# Patient Record
Sex: Female | Born: 1940
Health system: Southern US, Community
[De-identification: ages and names within clinical notes are randomized; demographics above are authoritative.]

## PROBLEM LIST (undated history)

## (undated) DIAGNOSIS — J439 Emphysema, unspecified: Secondary | ICD-10-CM

## (undated) DIAGNOSIS — E7800 Pure hypercholesterolemia, unspecified: Secondary | ICD-10-CM

## (undated) DIAGNOSIS — I10 Essential (primary) hypertension: Secondary | ICD-10-CM

## (undated) DIAGNOSIS — E559 Vitamin D deficiency, unspecified: Secondary | ICD-10-CM

## (undated) DIAGNOSIS — H04123 Dry eye syndrome of bilateral lacrimal glands: Secondary | ICD-10-CM

## (undated) DIAGNOSIS — R55 Syncope and collapse: Secondary | ICD-10-CM

## (undated) DIAGNOSIS — M8580 Other specified disorders of bone density and structure, unspecified site: Secondary | ICD-10-CM

## (undated) DIAGNOSIS — M542 Cervicalgia: Secondary | ICD-10-CM

## (undated) HISTORY — DX: Vitamin D deficiency, unspecified: E55.9

## (undated) HISTORY — DX: Syncope and collapse: R55

## (undated) HISTORY — DX: Other specified disorders of bone density and structure, unspecified site: M85.80

## (undated) HISTORY — DX: Emphysema, unspecified: J43.9

## (undated) HISTORY — PX: CATARACT EXTRACTION: SUR2

## (undated) HISTORY — DX: Cervicalgia: M54.2

## (undated) HISTORY — DX: Essential (primary) hypertension: I10

## (undated) HISTORY — PX: SQUAMOUS CELL CARCINOMA EXCISION: SHX2433

## (undated) HISTORY — DX: Dry eye syndrome of bilateral lacrimal glands: H04.123

## (undated) HISTORY — DX: Pure hypercholesterolemia, unspecified: E78.00

---

## 2000-08-29 ENCOUNTER — Other Ambulatory Visit: Admission: RE | Admit: 2000-08-29 | Discharge: 2000-08-29 | Payer: Self-pay | Admitting: *Deleted

## 2003-11-29 ENCOUNTER — Ambulatory Visit (HOSPITAL_COMMUNITY): Admission: RE | Admit: 2003-11-29 | Discharge: 2003-11-29 | Payer: Self-pay | Admitting: Ophthalmology

## 2003-12-13 ENCOUNTER — Ambulatory Visit (HOSPITAL_COMMUNITY): Admission: RE | Admit: 2003-12-13 | Discharge: 2003-12-13 | Payer: Self-pay | Admitting: Dermatology

## 2006-03-03 ENCOUNTER — Emergency Department (HOSPITAL_COMMUNITY): Admission: EM | Admit: 2006-03-03 | Discharge: 2006-03-03 | Payer: Self-pay | Admitting: Emergency Medicine

## 2006-03-03 ENCOUNTER — Encounter (INDEPENDENT_AMBULATORY_CARE_PROVIDER_SITE_OTHER): Payer: Self-pay | Admitting: Cardiology

## 2006-03-11 ENCOUNTER — Ambulatory Visit: Payer: Self-pay | Admitting: Internal Medicine

## 2006-03-25 ENCOUNTER — Ambulatory Visit: Payer: Self-pay | Admitting: Internal Medicine

## 2006-04-09 ENCOUNTER — Ambulatory Visit: Payer: Self-pay | Admitting: Internal Medicine

## 2006-07-07 ENCOUNTER — Ambulatory Visit: Payer: Self-pay | Admitting: Internal Medicine

## 2006-07-07 LAB — CONVERTED CEMR LAB
ALT: 9 units/L (ref 0–40)
AST: 17 units/L (ref 0–37)
Cholesterol: 183 mg/dL (ref 0–200)
HDL: 43.9 mg/dL (ref >39.0)
Triglyceride fasting, serum: 88 mg/dL (ref 0–149)

## 2006-11-10 ENCOUNTER — Ambulatory Visit: Payer: Self-pay | Admitting: Internal Medicine

## 2006-11-10 LAB — CONVERTED CEMR LAB
ALT: 12 units/L (ref 0–40)
Cholesterol: 197 mg/dL (ref 0–200)
Total CHOL/HDL Ratio: 4.2
VLDL: 16 mg/dL (ref 0–40)

## 2006-11-18 ENCOUNTER — Ambulatory Visit: Payer: Self-pay | Admitting: Internal Medicine

## 2006-11-25 ENCOUNTER — Ambulatory Visit: Payer: Self-pay | Admitting: Internal Medicine

## 2006-12-03 ENCOUNTER — Ambulatory Visit: Payer: Self-pay | Admitting: Internal Medicine

## 2006-12-03 LAB — CONVERTED CEMR LAB
Albumin: 4.2 g/dL (ref 3.5–5.2)
Alkaline Phosphatase: 90 units/L (ref 39–117)
BUN: 9 mg/dL (ref 6–23)
Basophils Absolute: 0 10*3/uL (ref 0.0–0.1)
Bilirubin, Direct: 0.2 mg/dL (ref 0.0–0.3)
Creatinine, Ser: 0.9 mg/dL (ref 0.4–1.2)
Eosinophils Absolute: 0 10*3/uL (ref 0.0–0.6)
Eosinophils Relative: 0.2 % (ref 0.0–5.0)
GFR calc Af Amer: 81 mL/min
GFR calc non Af Amer: 67 mL/min
HCT: 48.7 % — ABNORMAL HIGH (ref 36.0–46.0)
Hemoglobin: 16.8 g/dL — ABNORMAL HIGH (ref 12.0–15.0)
MCV: 94.8 fL (ref 78.0–100.0)
Magnesium: 2.2 mg/dL (ref 1.5–2.5)
Monocytes Absolute: 0.6 10*3/uL (ref 0.2–0.7)
Neutro Abs: 9.6 10*3/uL — ABNORMAL HIGH (ref 1.4–7.7)
Neutrophils Relative %: 69.3 % (ref 43.0–77.0)
Potassium: 4.2 meq/L (ref 3.5–5.1)
RBC: 5.14 M/uL — ABNORMAL HIGH (ref 3.87–5.11)
Sodium: 137 meq/L (ref 135–145)
T3, Free: 3.3 pg/mL (ref 2.3–4.2)
Total CK: 49 units/L (ref 7–177)
WBC: 13.7 10*3/uL — ABNORMAL HIGH (ref 4.5–10.5)

## 2006-12-05 ENCOUNTER — Encounter (INDEPENDENT_AMBULATORY_CARE_PROVIDER_SITE_OTHER): Payer: Self-pay | Admitting: Diagnostic Radiology

## 2006-12-05 ENCOUNTER — Ambulatory Visit: Payer: Self-pay | Admitting: Internal Medicine

## 2006-12-05 ENCOUNTER — Encounter: Payer: Self-pay | Admitting: Internal Medicine

## 2006-12-05 ENCOUNTER — Other Ambulatory Visit: Admission: RE | Admit: 2006-12-05 | Discharge: 2006-12-05 | Payer: Self-pay | Admitting: Internal Medicine

## 2006-12-05 ENCOUNTER — Encounter: Admission: RE | Admit: 2006-12-05 | Discharge: 2006-12-05 | Payer: Self-pay | Admitting: Internal Medicine

## 2006-12-16 ENCOUNTER — Ambulatory Visit: Payer: Self-pay | Admitting: Internal Medicine

## 2006-12-17 ENCOUNTER — Encounter: Payer: Self-pay | Admitting: Internal Medicine

## 2006-12-18 ENCOUNTER — Ambulatory Visit: Payer: Self-pay | Admitting: Internal Medicine

## 2006-12-31 ENCOUNTER — Ambulatory Visit: Payer: Self-pay | Admitting: Endocrinology

## 2006-12-31 LAB — CONVERTED CEMR LAB: Cortisol, Plasma: 27.1 ug/dL

## 2007-01-01 ENCOUNTER — Encounter: Payer: Self-pay | Admitting: Internal Medicine

## 2007-01-01 DIAGNOSIS — E785 Hyperlipidemia, unspecified: Secondary | ICD-10-CM

## 2007-01-01 DIAGNOSIS — F17200 Nicotine dependence, unspecified, uncomplicated: Secondary | ICD-10-CM

## 2007-01-02 ENCOUNTER — Encounter: Payer: Self-pay | Admitting: Endocrinology

## 2007-01-02 LAB — CONVERTED CEMR LAB
Catecholamines Tot(E+NE) 24 Hr U: 0.073 mg/24hr
Dopamine 24 Hr Urine: 215 mcg/24hr (ref <500)
Epinephrine 24 Hr Urine: 7 mcg/24hr (ref <20)
Metanephrines, Ur: 112 (ref 19–140)
Norepinephrine 24 Hr Urine: 66 mcg/24hr (ref <80)
Normetanephrine, 24H Ur: 383 — ABNORMAL HIGH (ref 52–310)

## 2007-06-04 ENCOUNTER — Encounter: Admission: RE | Admit: 2007-06-04 | Discharge: 2007-06-04 | Payer: Self-pay | Admitting: Internal Medicine

## 2008-01-08 ENCOUNTER — Encounter: Admission: RE | Admit: 2008-01-08 | Discharge: 2008-01-08 | Payer: Self-pay | Admitting: Internal Medicine

## 2008-02-15 ENCOUNTER — Other Ambulatory Visit: Admission: RE | Admit: 2008-02-15 | Discharge: 2008-02-15 | Payer: Self-pay | Admitting: Obstetrics & Gynecology

## 2009-01-27 ENCOUNTER — Encounter: Admission: RE | Admit: 2009-01-27 | Discharge: 2009-01-27 | Payer: Self-pay | Admitting: Internal Medicine

## 2009-06-02 ENCOUNTER — Emergency Department (HOSPITAL_COMMUNITY): Admission: EM | Admit: 2009-06-02 | Discharge: 2009-06-02 | Payer: Self-pay | Admitting: Emergency Medicine

## 2009-08-08 ENCOUNTER — Inpatient Hospital Stay (HOSPITAL_COMMUNITY): Admission: EM | Admit: 2009-08-08 | Discharge: 2009-08-10 | Payer: Self-pay | Admitting: Emergency Medicine

## 2010-09-12 LAB — URINE MICROSCOPIC-ADD ON

## 2010-09-12 LAB — COMPREHENSIVE METABOLIC PANEL
AST: 20 U/L (ref 0–37)
Alkaline Phosphatase: 92 U/L (ref 39–117)
BUN: 4 mg/dL — ABNORMAL LOW (ref 6–23)
BUN: 7 mg/dL (ref 6–23)
BUN: 9 mg/dL (ref 6–23)
CO2: 26 mEq/L (ref 19–32)
Calcium: 8.8 mg/dL (ref 8.4–10.5)
Chloride: 101 mEq/L (ref 96–112)
Chloride: 108 mEq/L (ref 96–112)
Creatinine, Ser: 0.54 mg/dL (ref 0.4–1.2)
Creatinine, Ser: 0.56 mg/dL (ref 0.4–1.2)
GFR calc Af Amer: >60 mL/min (ref >60)
GFR calc Af Amer: >60 mL/min (ref >60)
GFR calc non Af Amer: >60 mL/min (ref >60)
Glucose, Bld: 101 mg/dL — ABNORMAL HIGH (ref 70–99)
Glucose, Bld: 106 mg/dL — ABNORMAL HIGH (ref 70–99)
Glucose, Bld: 89 mg/dL (ref 70–99)
Sodium: 135 mEq/L (ref 135–145)
Sodium: 137 mEq/L (ref 135–145)
Total Bilirubin: 0.4 mg/dL (ref 0.3–1.2)
Total Protein: 5.6 g/dL — ABNORMAL LOW (ref 6.0–8.3)
Total Protein: 6.8 g/dL (ref 6.0–8.3)

## 2010-09-12 LAB — DIFFERENTIAL
Basophils Absolute: 0 10*3/uL (ref 0.0–0.1)
Basophils Absolute: 0 10*3/uL (ref 0.0–0.1)
Basophils Relative: 0 % (ref 0–1)
Eosinophils Absolute: 0 10*3/uL (ref 0.0–0.7)
Eosinophils Relative: 1 % (ref 0–5)
Lymphocytes Relative: 11 % — ABNORMAL LOW (ref 12–46)
Lymphocytes Relative: 13 % (ref 12–46)
Lymphocytes Relative: 16 % (ref 12–46)
Lymphs Abs: 2 10*3/uL (ref 0.7–4.0)
Lymphs Abs: 3 10*3/uL (ref 0.7–4.0)
Monocytes Absolute: 1 10*3/uL (ref 0.1–1.0)
Monocytes Relative: 6 % (ref 3–12)
Neutro Abs: 14.1 10*3/uL — ABNORMAL HIGH (ref 1.7–7.7)
Neutrophils Relative %: 76 % (ref 43–77)
Neutrophils Relative %: 78 % — ABNORMAL HIGH (ref 43–77)

## 2010-09-12 LAB — CBC
HCT: 34.8 % — ABNORMAL LOW (ref 36.0–46.0)
HCT: 36 % (ref 36.0–46.0)
Hemoglobin: 12.1 g/dL (ref 12.0–15.0)
Hemoglobin: 12.8 g/dL (ref 12.0–15.0)
Hemoglobin: 13.4 g/dL (ref 12.0–15.0)
MCHC: 34.4 g/dL (ref 30.0–36.0)
MCHC: 35.6 g/dL (ref 30.0–36.0)
MCV: 95.2 fL (ref 78.0–100.0)
MCV: 95.8 fL (ref 78.0–100.0)
RBC: 3.63 MIL/uL — ABNORMAL LOW (ref 3.87–5.11)
RBC: 4.09 MIL/uL (ref 3.87–5.11)
RDW: 12.6 % (ref 11.5–15.5)
WBC: 15.4 10*3/uL — ABNORMAL HIGH (ref 4.0–10.5)
WBC: 17.9 10*3/uL — ABNORMAL HIGH (ref 4.0–10.5)

## 2010-09-12 LAB — URINALYSIS, ROUTINE W REFLEX MICROSCOPIC
Bilirubin Urine: NEGATIVE
Ketones, ur: NEGATIVE mg/dL

## 2010-09-12 LAB — CULTURE, BLOOD (ROUTINE X 2)

## 2010-09-12 LAB — URINE CULTURE: Colony Count: NO GROWTH

## 2010-09-12 LAB — CLOSTRIDIUM DIFFICILE EIA

## 2010-09-25 LAB — URINE MICROSCOPIC-ADD ON

## 2010-09-25 LAB — DIFFERENTIAL
Basophils Absolute: 0 10*3/uL (ref 0.0–0.1)
Eosinophils Relative: 0 % (ref 0–5)
Lymphocytes Relative: 15 % (ref 12–46)
Lymphs Abs: 2.1 10*3/uL (ref 0.7–4.0)
Monocytes Absolute: 0.5 10*3/uL (ref 0.1–1.0)
Monocytes Relative: 4 % (ref 3–12)

## 2010-09-25 LAB — URINALYSIS, ROUTINE W REFLEX MICROSCOPIC
Glucose, UA: NEGATIVE mg/dL
Specific Gravity, Urine: 1.023 (ref 1.005–1.030)
Urobilinogen, UA: 0.2 mg/dL (ref 0.0–1.0)

## 2010-09-25 LAB — BASIC METABOLIC PANEL
GFR calc Af Amer: >60 mL/min (ref >60)
GFR calc non Af Amer: >60 mL/min (ref >60)
Glucose, Bld: 110 mg/dL — ABNORMAL HIGH (ref 70–99)
Potassium: 5.2 mEq/L — ABNORMAL HIGH (ref 3.5–5.1)
Sodium: 134 mEq/L — ABNORMAL LOW (ref 135–145)

## 2010-09-25 LAB — URINE CULTURE

## 2010-09-25 LAB — CBC
HCT: 44.7 % (ref 36.0–46.0)
Hemoglobin: 15.4 g/dL — ABNORMAL HIGH (ref 12.0–15.0)
RBC: 4.66 MIL/uL (ref 3.87–5.11)
RDW: 12.7 % (ref 11.5–15.5)

## 2010-09-25 LAB — POCT CARDIAC MARKERS
CKMB, poc: <1 ng/mL — ABNORMAL LOW (ref 1.0–8.0)
Troponin i, poc: <0.05 ng/mL (ref 0.00–0.09)

## 2010-11-06 NOTE — Consult Note (Signed)
[facility]                          ENDOCRINOLOGY CONSULTATION   Alyssa Howard, Alyssa Howard                     MRN:          [id number]  DATE:12/31/2006                            DOB:          1940/07/16    REFERRING PHYSICIAN:  Neta Mends. Panosh, MD   REASON FOR REFERRAL:  Weight loss.   HISTORY OF PRESENT ILLNESS:  A 70 year old woman with 8 months of  fatigue.  She has associated slight tremor of her hands as well as  anxiety and palpitations.  She also has a 30-pound weight loss with a  poor appetite.   PAST MEDICAL HISTORY:  Otherwise healthy.   MEDICATIONS:  No medications.   SOCIAL HISTORY:  She does not work outside the home.  She is here with  her husband.   FAMILY HISTORY:  Negative for the above.   REVIEW OF SYSTEMS:  Denies the following:  Fever, headache, syncope and  abdominal pain.   PHYSICAL EXAMINATION:  Blood pressure 123/76, heart rate 98, temperature  97.5 and the weight is 113.  GENERAL:  No distress.  SKIN:  No rash, not diaphoretic.  HEENT:  No proptosis.  No periorbital swelling.  NECK:  Thyroid is normal.  CHEST:  Clear to auscultation.  No respiratory distress.  CARDIOVASCULAR:  No edema.  Regular rate and rhythm.  No murmur.  Pedal  pulses are intact.  NEUROLOGIC:  Alert and oriented.  Does not appear anxious nor depressed  and there is a slight postural tremor present.   LABORATORY STUDIES:  Forwarded by Dr. Fabian Sharp:  On March 12, 2006,  TSH 1.7.  On Nov 02, 2006, TSH 1.96.   Abnormal chest CT is also noted.   In the office on December 31, 2006, she has an ACTH stimulation test,  baseline cortisol 14.7 mcg/dL.  She then received 250 mcg of  cosyntropin.  Forty-five minutes after the injection, a repeat cortisol  was 27 mcg/dL.   IMPRESSION:  1. Weight loss of uncertain etiology.  2. She is euthyroid.  3. Other symptoms as noted above.  4. Adrenal insufficiency is ruled out.  5. Abnormal chest CT.    PLAN:  1. Check 24-hour urine for catecholamines and metanephrines.  2. I told the patient that if this is normal, I cannot find any      endocrine etiology for her symptoms.  3. Continue followup for abnormal CT, as Dr. Fabian Sharp is doing.     Sean A. Everardo All, MD  Electronically Signed    SAE/MedQ  DD: 01/03/2007  DT: 01/05/2007  Job #: [id number]   cc:   Neta Mends. Fabian Sharp, MD

## 2010-11-09 NOTE — Assessment & Plan Note (Signed)
[facility] OFFICE NOTE   Alyssa Howard, Alyssa Howard                     MRN:          [id number]  DATE:03/11/2006                            DOB:          1940-12-14    CHIEF COMPLAINT:  The patient needs cholesterol checked after ER evaluation.   HISTORY OF PRESENT ILLNESS:  Alyssa Howard is a 70 year old, one pack per day  since age 32, married, retired, white female comes in today for the above  reason.  She had been seen at the practice a number of years ago, more than  five years ago, but has not had any need to go to the doctor because she  has generally been well.  However, on the evening of February 28, 2006, when  she got up in the middle of the night to use the bathroom, she remembers  reaching for the door, hitting her right forehead and passing out at that  time.  She felt badly afterwards and lay down.  She vomited 2 separate times  that evening and then improved without any recurrence; however, she still  did not feel just right, felt that her arms were heavy and had some bruises  on her right arm and her husband took her to the emergency room on March 03, 2006, where she had a 10-hour evaluation which included EKGs, general  lab work, MRI of the brain and head, and MRI of the C spine without  contrast.  There was no acute stroke but did show some mild atrophy and  small vessel disease.  C spine showed negative cord contusion or fracture  but some central stenosis in C3-C4, C4-C5, and C5-C6, some mild to moderate  focal stenosis of the right proximal subclavian artery with mild narrowing  of the proximal right vertebral artery, otherwise there was no  hemodynamically significant stenosis in either carotid bifurcation.  The  emergency room physician had recommended she come in for followup and  cholesterol check because of the above.  Since that time she has felt well  with no recurrence of her  symptoms.   PAST MEDICAL HISTORY:  See database.  Positive for:  1. Chicken Pulse ox.  2. High cholesterol, otherwise noncontributory.  3. No surgeries.  4. She is gravida 1, para 1.  5. Her last tetanus shot, she believes, was in 24.  6. Denies any other surgeries or hospitalizations.   MEDICATIONS:  Calcium supplements.   DRUG ALLERGIES:  NONE BUT GETS NAUSEA AND VOMITING WITH PAIN MEDICATIONS.   FAMILY HISTORY:  Positive for hyperlipidemia in her mother and also type 2  diabetes.  She has 2 sisters that are apparently well and no family history  of arrhythmias.   SOCIAL HISTORY:  Retired, household of 2, 8-9 hours of sleep, no alcohol,  one pack per day tried to quit but was only successful 6 months in her  lifetime, some caffeine, see database.   REVIEW OF SYSTEMS:  Negative for chest pain, shortness of breath,  palpitations, recurrent dizziness.  She did have significant anxiety in the  MRI and prefers never to have that again, and hesitates to do more of a  workup but does want to know what her cholesterol is.   OBJECTIVE:  VITAL SIGNS:  Weight 144 pounds, temperature 97.1, pulse 88 and  regular, respirations 16, blood pressure 110/82.  GENERAL:  WD/WN, healthy-appearing, elderly lady in no acute distress.  HEENT:  Normocephalic.  TMs clear.  Tongue midline.  OP clear.  NECK:  No bruits or masses are felt.  CHEST:  CTAP is equal.  CARDIAC:  S1 S2.  No gallops.  Question 1/6 systolic murmur at the right  upper sternal border lying in the supine position.  No bruits are heard over  the femoral, carotids, and abdominal areas.  Pulses are intact.  ABDOMEN:  Soft without organomegaly, guarding, or rebound.  NEUROLOGIC:  Appears intact with no focal deficits of motor skills.  Cranial  nerves III-XII appear normal but hearing is not tested.  Gait is within  normal limits.  There is negative Romberg.   IMPRESSION:  1. Syncope which sounds like vasovagal but in a high risk  situation.  2. Abnormal MR angiogram of the neck and subclavian.  3. Risk for vascular disease which is tobacco and history of      hyperlipidemia.   PLAN:  Discussed getting fasting lipids, TSH, and BMP today to check her  blood sugars and then plan consult as appropriate after I get those back.  She needs to be on risk reduction therapy including tobacco cessation,  etcetera.  Would recommend getting a consult from the vascular surgeons who  can look at her MRA and her history at that time.                                   Neta Mends. Fabian Sharp, MD   WKP/MedQ  DD:  03/11/2006  DT:  03/12/2006  Job #:  [id number]

## 2010-12-10 IMAGING — CR DG CHEST 2V
2 series · 2 of 2 positions shown · non-contrast
Comparison: 06/02/2009

CLINICAL DATA: Cough, fever

CHEST - 2 VIEW

[w chest lat]
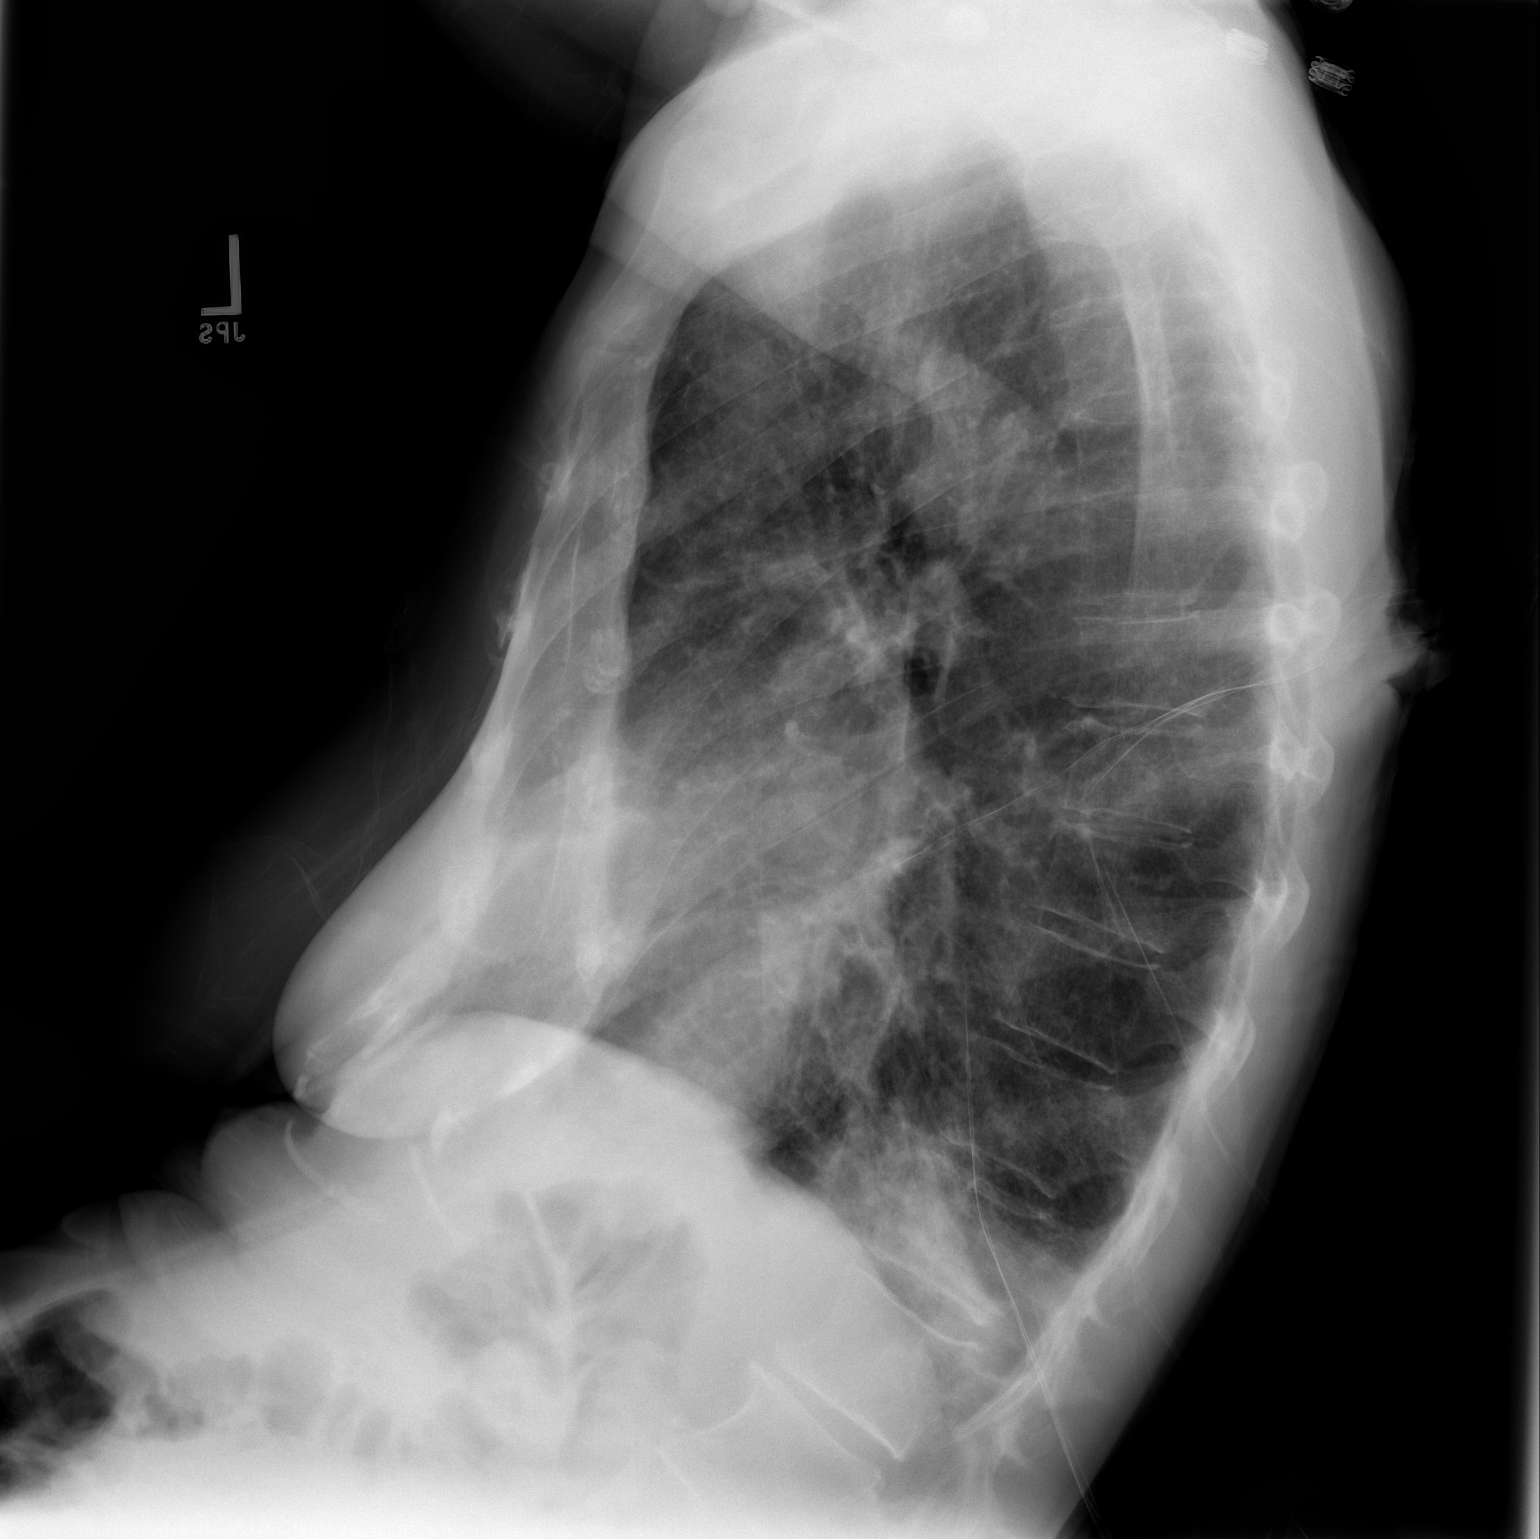

[w chest pa]
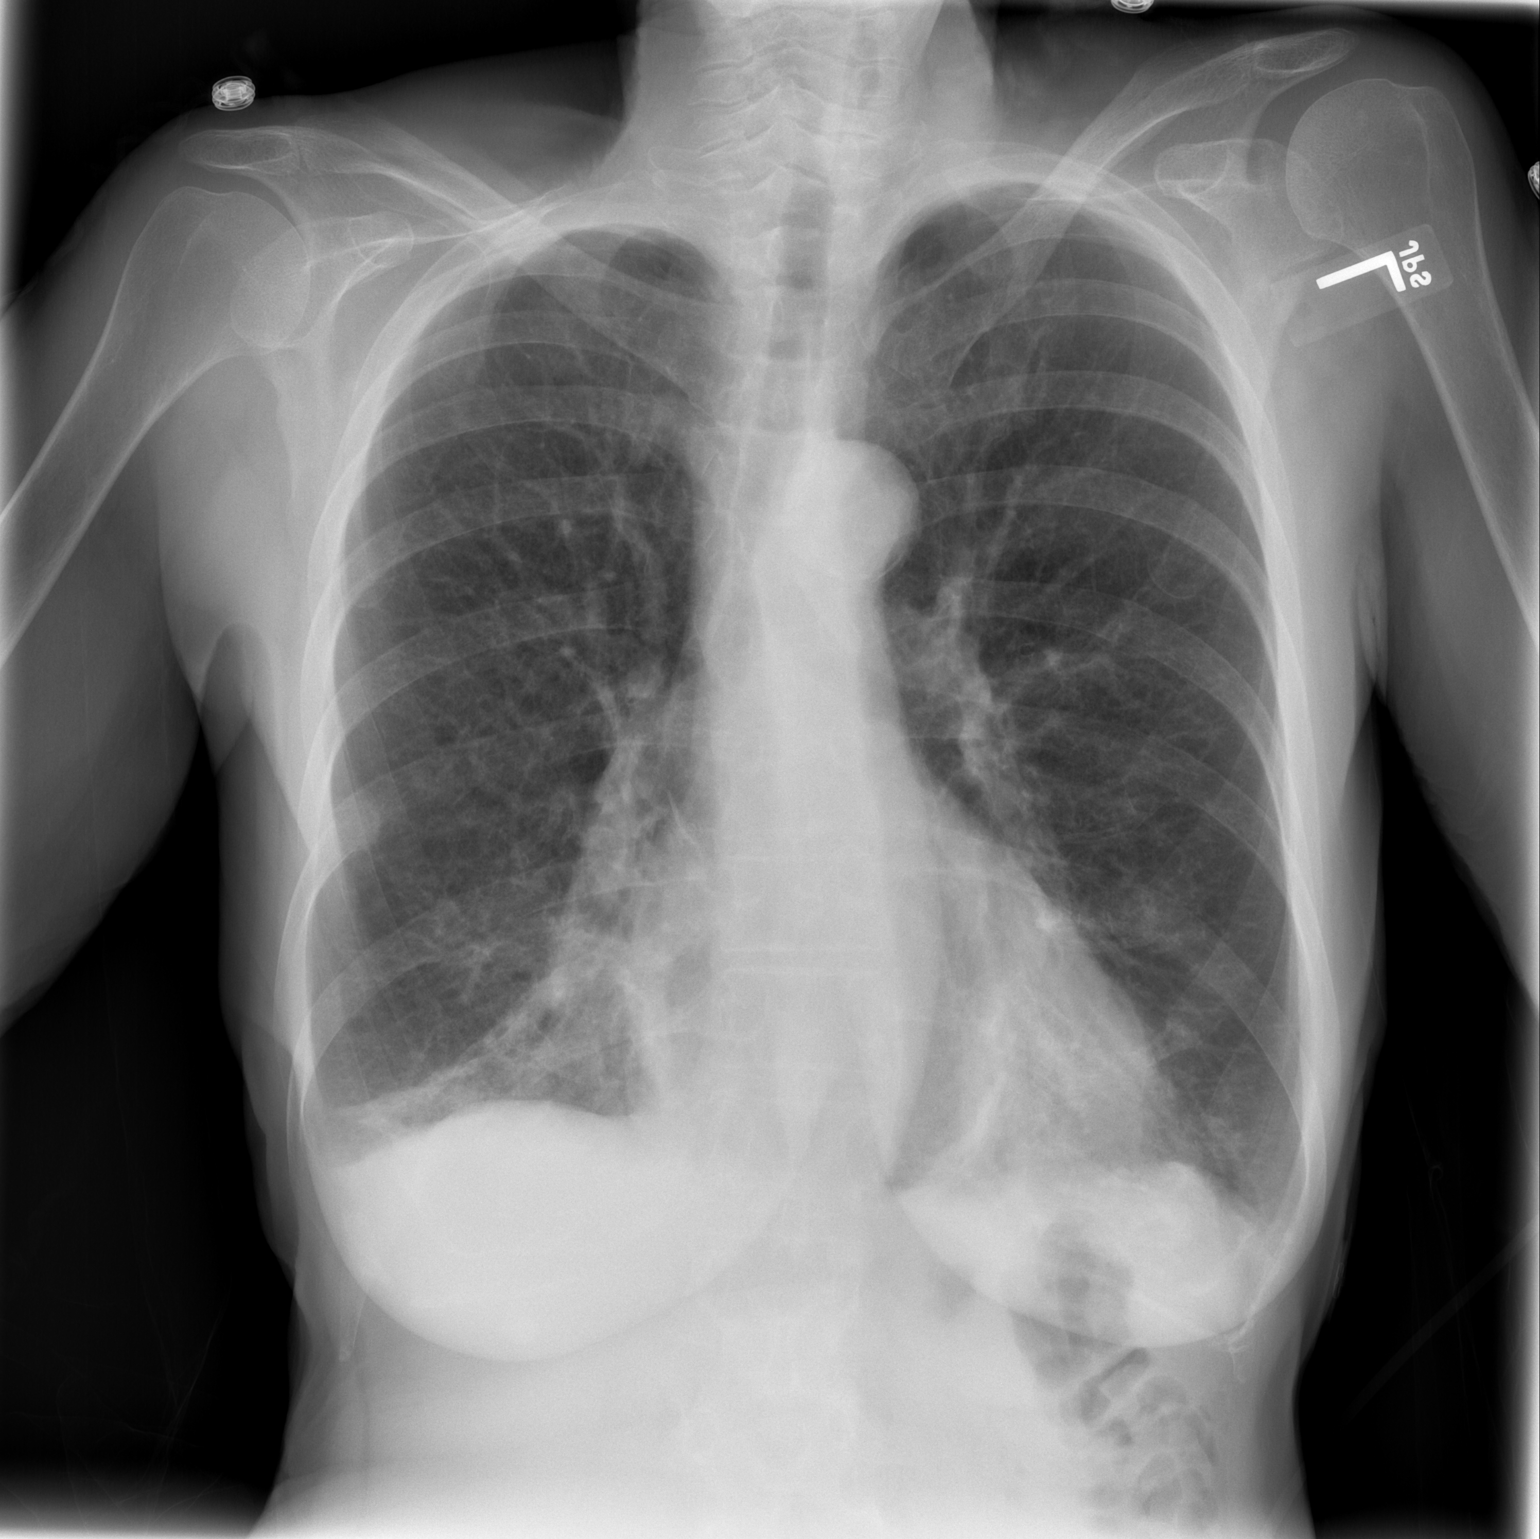

[2 of 2 positions shown; findings below may reference images not displayed]

FINDINGS: New right lower lobe consolidation is noted with air
bronchogram formation.  Linear retrocardiac probable atelectasis
versus additional area of pulmonary consolidation noted.  No
pleural effusion identified.  Heart size is normal.  Hyperinflation
again noted.
IMPRESSION: New right lower lobe and possibly left lower lobe airspace opacity,
compatible with pneumonia given the clinical context.

## 2011-02-05 ENCOUNTER — Other Ambulatory Visit: Payer: Self-pay | Admitting: Surgery

## 2011-02-05 DIAGNOSIS — Z1231 Encounter for screening mammogram for malignant neoplasm of breast: Secondary | ICD-10-CM

## 2011-02-15 ENCOUNTER — Other Ambulatory Visit: Payer: Self-pay | Admitting: Internal Medicine

## 2011-02-15 ENCOUNTER — Ambulatory Visit
Admission: RE | Admit: 2011-02-15 | Discharge: 2011-02-15 | Disposition: A | Discharge status: Home / Self Care | Payer: Medicare Other | Source: Ambulatory Visit | Attending: Surgery | Admitting: Surgery

## 2011-02-15 DIAGNOSIS — Z1231 Encounter for screening mammogram for malignant neoplasm of breast: Secondary | ICD-10-CM

## 2011-06-27 DIAGNOSIS — R0609 Other forms of dyspnea: Secondary | ICD-10-CM | POA: Diagnosis not present

## 2011-06-27 DIAGNOSIS — R1031 Right lower quadrant pain: Secondary | ICD-10-CM | POA: Diagnosis not present

## 2011-07-16 DIAGNOSIS — R109 Unspecified abdominal pain: Secondary | ICD-10-CM | POA: Diagnosis not present

## 2011-07-16 DIAGNOSIS — R1031 Right lower quadrant pain: Secondary | ICD-10-CM | POA: Diagnosis not present

## 2011-07-16 DIAGNOSIS — Q6101 Congenital single renal cyst: Secondary | ICD-10-CM | POA: Diagnosis not present

## 2011-08-26 ENCOUNTER — Encounter: Payer: Self-pay | Admitting: Gastroenterology

## 2011-09-11 ENCOUNTER — Other Ambulatory Visit: Payer: Self-pay

## 2011-09-11 DIAGNOSIS — L821 Other seborrheic keratosis: Secondary | ICD-10-CM | POA: Diagnosis not present

## 2011-09-11 DIAGNOSIS — C44721 Squamous cell carcinoma of skin of unspecified lower limb, including hip: Secondary | ICD-10-CM | POA: Diagnosis not present

## 2011-09-11 DIAGNOSIS — L570 Actinic keratosis: Secondary | ICD-10-CM | POA: Diagnosis not present

## 2011-09-25 ENCOUNTER — Encounter: Payer: Self-pay | Admitting: *Deleted

## 2011-10-23 DIAGNOSIS — Z85828 Personal history of other malignant neoplasm of skin: Secondary | ICD-10-CM | POA: Diagnosis not present

## 2011-10-23 DIAGNOSIS — D485 Neoplasm of uncertain behavior of skin: Secondary | ICD-10-CM | POA: Diagnosis not present

## 2011-10-23 DIAGNOSIS — L570 Actinic keratosis: Secondary | ICD-10-CM | POA: Diagnosis not present

## 2011-10-23 DIAGNOSIS — C44320 Squamous cell carcinoma of skin of unspecified parts of face: Secondary | ICD-10-CM | POA: Diagnosis not present

## 2011-10-23 DIAGNOSIS — L821 Other seborrheic keratosis: Secondary | ICD-10-CM | POA: Diagnosis not present

## 2011-11-19 DIAGNOSIS — H04129 Dry eye syndrome of unspecified lacrimal gland: Secondary | ICD-10-CM | POA: Diagnosis not present

## 2011-11-20 DIAGNOSIS — C44320 Squamous cell carcinoma of skin of unspecified parts of face: Secondary | ICD-10-CM | POA: Diagnosis not present

## 2011-11-29 ENCOUNTER — Encounter: Payer: Self-pay | Admitting: Cardiology

## 2012-01-29 ENCOUNTER — Other Ambulatory Visit: Payer: Self-pay

## 2012-01-29 DIAGNOSIS — C44611 Basal cell carcinoma of skin of unspecified upper limb, including shoulder: Secondary | ICD-10-CM | POA: Diagnosis not present

## 2012-01-29 DIAGNOSIS — Z85828 Personal history of other malignant neoplasm of skin: Secondary | ICD-10-CM | POA: Diagnosis not present

## 2012-01-29 DIAGNOSIS — D692 Other nonthrombocytopenic purpura: Secondary | ICD-10-CM | POA: Diagnosis not present

## 2012-01-29 DIAGNOSIS — L821 Other seborrheic keratosis: Secondary | ICD-10-CM | POA: Diagnosis not present

## 2012-01-29 DIAGNOSIS — L570 Actinic keratosis: Secondary | ICD-10-CM | POA: Diagnosis not present

## 2012-02-18 DIAGNOSIS — C44611 Basal cell carcinoma of skin of unspecified upper limb, including shoulder: Secondary | ICD-10-CM | POA: Diagnosis not present

## 2012-03-06 ENCOUNTER — Other Ambulatory Visit: Payer: Self-pay | Admitting: Internal Medicine

## 2012-03-06 DIAGNOSIS — Z1231 Encounter for screening mammogram for malignant neoplasm of breast: Secondary | ICD-10-CM

## 2012-04-06 ENCOUNTER — Ambulatory Visit
Admission: RE | Admit: 2012-04-06 | Discharge: 2012-04-06 | Disposition: A | Discharge status: Home / Self Care | Payer: Medicare Other | Source: Ambulatory Visit | Attending: Internal Medicine | Admitting: Internal Medicine

## 2012-04-06 DIAGNOSIS — Z1231 Encounter for screening mammogram for malignant neoplasm of breast: Secondary | ICD-10-CM | POA: Diagnosis not present

## 2012-04-29 DIAGNOSIS — M810 Age-related osteoporosis without current pathological fracture: Secondary | ICD-10-CM | POA: Diagnosis not present

## 2012-04-29 DIAGNOSIS — E782 Mixed hyperlipidemia: Secondary | ICD-10-CM | POA: Diagnosis not present

## 2012-05-06 DIAGNOSIS — Z23 Encounter for immunization: Secondary | ICD-10-CM | POA: Diagnosis not present

## 2012-05-06 DIAGNOSIS — E559 Vitamin D deficiency, unspecified: Secondary | ICD-10-CM | POA: Diagnosis not present

## 2012-05-06 DIAGNOSIS — Z1331 Encounter for screening for depression: Secondary | ICD-10-CM | POA: Diagnosis not present

## 2012-05-06 DIAGNOSIS — E782 Mixed hyperlipidemia: Secondary | ICD-10-CM | POA: Diagnosis not present

## 2012-05-06 DIAGNOSIS — R82998 Other abnormal findings in urine: Secondary | ICD-10-CM | POA: Diagnosis not present

## 2012-05-06 DIAGNOSIS — Z0000 Encounter for general adult medical examination without abnormal findings: Secondary | ICD-10-CM | POA: Diagnosis not present

## 2012-05-07 DIAGNOSIS — Z1212 Encounter for screening for malignant neoplasm of rectum: Secondary | ICD-10-CM | POA: Diagnosis not present

## 2012-05-22 DIAGNOSIS — R820 Chyluria: Secondary | ICD-10-CM | POA: Diagnosis not present

## 2012-05-22 DIAGNOSIS — N3090 Cystitis, unspecified without hematuria: Secondary | ICD-10-CM | POA: Diagnosis not present

## 2012-05-22 DIAGNOSIS — Z1331 Encounter for screening for depression: Secondary | ICD-10-CM | POA: Diagnosis not present

## 2012-06-04 DIAGNOSIS — R3129 Other microscopic hematuria: Secondary | ICD-10-CM | POA: Diagnosis not present

## 2012-06-10 DIAGNOSIS — N281 Cyst of kidney, acquired: Secondary | ICD-10-CM | POA: Diagnosis not present

## 2012-06-10 DIAGNOSIS — R3129 Other microscopic hematuria: Secondary | ICD-10-CM | POA: Diagnosis not present

## 2012-06-11 DIAGNOSIS — N281 Cyst of kidney, acquired: Secondary | ICD-10-CM | POA: Diagnosis not present

## 2012-06-11 DIAGNOSIS — R3129 Other microscopic hematuria: Secondary | ICD-10-CM | POA: Diagnosis not present

## 2012-11-09 ENCOUNTER — Other Ambulatory Visit (HOSPITAL_COMMUNITY): Payer: Self-pay | Admitting: Urology

## 2012-11-09 DIAGNOSIS — N281 Cyst of kidney, acquired: Secondary | ICD-10-CM

## 2012-11-19 DIAGNOSIS — H04129 Dry eye syndrome of unspecified lacrimal gland: Secondary | ICD-10-CM | POA: Diagnosis not present

## 2012-12-10 ENCOUNTER — Ambulatory Visit (HOSPITAL_COMMUNITY)
Admission: RE | Admit: 2012-12-10 | Discharge: 2012-12-10 | Disposition: A | Discharge status: Home / Self Care | Payer: Medicare Other | Source: Ambulatory Visit | Attending: Urology | Admitting: Urology

## 2012-12-10 DIAGNOSIS — N289 Disorder of kidney and ureter, unspecified: Secondary | ICD-10-CM | POA: Insufficient documentation

## 2012-12-10 DIAGNOSIS — R3129 Other microscopic hematuria: Secondary | ICD-10-CM | POA: Insufficient documentation

## 2012-12-10 DIAGNOSIS — N281 Cyst of kidney, acquired: Secondary | ICD-10-CM | POA: Diagnosis not present

## 2012-12-10 LAB — CREATININE, SERUM
Creatinine, Ser: 0.69 mg/dL (ref 0.50–1.10)
GFR calc Af Amer: >90 mL/min (ref >90)
GFR calc non Af Amer: 85 mL/min — ABNORMAL LOW (ref >90)

## 2012-12-10 MED ORDER — GADOBENATE DIMEGLUMINE 529 MG/ML IV SOLN
11.0000 mL | Freq: Once | INTRAVENOUS | Status: AC | PRN
  Administered 2012-12-10: 11 mL via INTRAVENOUS

## 2012-12-21 DIAGNOSIS — N281 Cyst of kidney, acquired: Secondary | ICD-10-CM | POA: Diagnosis not present

## 2013-01-27 DIAGNOSIS — D485 Neoplasm of uncertain behavior of skin: Secondary | ICD-10-CM | POA: Diagnosis not present

## 2013-01-27 DIAGNOSIS — D1801 Hemangioma of skin and subcutaneous tissue: Secondary | ICD-10-CM | POA: Diagnosis not present

## 2013-01-27 DIAGNOSIS — L723 Sebaceous cyst: Secondary | ICD-10-CM | POA: Diagnosis not present

## 2013-01-27 DIAGNOSIS — Z85828 Personal history of other malignant neoplasm of skin: Secondary | ICD-10-CM | POA: Diagnosis not present

## 2013-01-27 DIAGNOSIS — C44320 Squamous cell carcinoma of skin of unspecified parts of face: Secondary | ICD-10-CM | POA: Diagnosis not present

## 2013-01-27 DIAGNOSIS — L570 Actinic keratosis: Secondary | ICD-10-CM | POA: Diagnosis not present

## 2013-01-27 DIAGNOSIS — L821 Other seborrheic keratosis: Secondary | ICD-10-CM | POA: Diagnosis not present

## 2013-02-11 DIAGNOSIS — Z85828 Personal history of other malignant neoplasm of skin: Secondary | ICD-10-CM | POA: Diagnosis not present

## 2013-02-11 DIAGNOSIS — C44320 Squamous cell carcinoma of skin of unspecified parts of face: Secondary | ICD-10-CM | POA: Diagnosis not present

## 2013-05-07 DIAGNOSIS — R82998 Other abnormal findings in urine: Secondary | ICD-10-CM | POA: Diagnosis not present

## 2013-05-07 DIAGNOSIS — E782 Mixed hyperlipidemia: Secondary | ICD-10-CM | POA: Diagnosis not present

## 2013-05-07 DIAGNOSIS — E559 Vitamin D deficiency, unspecified: Secondary | ICD-10-CM | POA: Diagnosis not present

## 2013-05-13 DIAGNOSIS — E210 Primary hyperparathyroidism: Secondary | ICD-10-CM | POA: Diagnosis not present

## 2013-05-13 DIAGNOSIS — M810 Age-related osteoporosis without current pathological fracture: Secondary | ICD-10-CM | POA: Diagnosis not present

## 2013-05-13 DIAGNOSIS — E559 Vitamin D deficiency, unspecified: Secondary | ICD-10-CM | POA: Diagnosis not present

## 2013-05-13 DIAGNOSIS — Z0000 Encounter for general adult medical examination without abnormal findings: Secondary | ICD-10-CM | POA: Diagnosis not present

## 2013-05-13 DIAGNOSIS — R319 Hematuria, unspecified: Secondary | ICD-10-CM | POA: Diagnosis not present

## 2013-05-13 DIAGNOSIS — E782 Mixed hyperlipidemia: Secondary | ICD-10-CM | POA: Diagnosis not present

## 2013-05-13 DIAGNOSIS — H409 Unspecified glaucoma: Secondary | ICD-10-CM | POA: Diagnosis not present

## 2013-05-13 DIAGNOSIS — F17200 Nicotine dependence, unspecified, uncomplicated: Secondary | ICD-10-CM | POA: Diagnosis not present

## 2013-05-18 DIAGNOSIS — Z1212 Encounter for screening for malignant neoplasm of rectum: Secondary | ICD-10-CM | POA: Diagnosis not present

## 2013-05-27 DIAGNOSIS — Z01419 Encounter for gynecological examination (general) (routine) without abnormal findings: Secondary | ICD-10-CM | POA: Diagnosis not present

## 2013-05-27 DIAGNOSIS — M810 Age-related osteoporosis without current pathological fracture: Secondary | ICD-10-CM | POA: Diagnosis not present

## 2013-05-27 DIAGNOSIS — N952 Postmenopausal atrophic vaginitis: Secondary | ICD-10-CM | POA: Diagnosis not present

## 2013-05-27 DIAGNOSIS — IMO0002 Reserved for concepts with insufficient information to code with codable children: Secondary | ICD-10-CM | POA: Diagnosis not present

## 2013-11-26 DIAGNOSIS — Z961 Presence of intraocular lens: Secondary | ICD-10-CM | POA: Diagnosis not present

## 2014-02-04 DIAGNOSIS — Z85828 Personal history of other malignant neoplasm of skin: Secondary | ICD-10-CM | POA: Diagnosis not present

## 2014-02-04 DIAGNOSIS — L570 Actinic keratosis: Secondary | ICD-10-CM | POA: Diagnosis not present

## 2014-02-04 DIAGNOSIS — L723 Sebaceous cyst: Secondary | ICD-10-CM | POA: Diagnosis not present

## 2014-02-04 DIAGNOSIS — L821 Other seborrheic keratosis: Secondary | ICD-10-CM | POA: Diagnosis not present

## 2014-03-29 DIAGNOSIS — Z23 Encounter for immunization: Secondary | ICD-10-CM | POA: Diagnosis not present

## 2014-04-11 ENCOUNTER — Other Ambulatory Visit: Payer: Self-pay

## 2014-04-11 DIAGNOSIS — Z1239 Encounter for other screening for malignant neoplasm of breast: Secondary | ICD-10-CM

## 2014-04-11 DIAGNOSIS — Z1231 Encounter for screening mammogram for malignant neoplasm of breast: Secondary | ICD-10-CM

## 2014-05-12 ENCOUNTER — Ambulatory Visit
Admission: RE | Admit: 2014-05-12 | Discharge: 2014-05-12 | Disposition: A | Discharge status: Home / Self Care | Payer: Medicare Other | Source: Ambulatory Visit

## 2014-05-12 DIAGNOSIS — Z1231 Encounter for screening mammogram for malignant neoplasm of breast: Secondary | ICD-10-CM

## 2014-05-17 ENCOUNTER — Other Ambulatory Visit: Payer: Self-pay | Admitting: Internal Medicine

## 2014-05-17 DIAGNOSIS — R928 Other abnormal and inconclusive findings on diagnostic imaging of breast: Secondary | ICD-10-CM

## 2014-05-25 DIAGNOSIS — M810 Age-related osteoporosis without current pathological fracture: Secondary | ICD-10-CM | POA: Diagnosis not present

## 2014-05-25 DIAGNOSIS — E782 Mixed hyperlipidemia: Secondary | ICD-10-CM | POA: Diagnosis not present

## 2014-05-25 DIAGNOSIS — Z0000 Encounter for general adult medical examination without abnormal findings: Secondary | ICD-10-CM | POA: Diagnosis not present

## 2014-06-03 DIAGNOSIS — E559 Vitamin D deficiency, unspecified: Secondary | ICD-10-CM | POA: Diagnosis not present

## 2014-06-03 DIAGNOSIS — Z1389 Encounter for screening for other disorder: Secondary | ICD-10-CM | POA: Diagnosis not present

## 2014-06-03 DIAGNOSIS — M810 Age-related osteoporosis without current pathological fracture: Secondary | ICD-10-CM | POA: Diagnosis not present

## 2014-06-03 DIAGNOSIS — C4491 Basal cell carcinoma of skin, unspecified: Secondary | ICD-10-CM | POA: Diagnosis not present

## 2014-06-03 DIAGNOSIS — F17200 Nicotine dependence, unspecified, uncomplicated: Secondary | ICD-10-CM | POA: Diagnosis not present

## 2014-06-03 DIAGNOSIS — Z0000 Encounter for general adult medical examination without abnormal findings: Secondary | ICD-10-CM | POA: Diagnosis not present

## 2014-06-03 DIAGNOSIS — E782 Mixed hyperlipidemia: Secondary | ICD-10-CM | POA: Diagnosis not present

## 2014-06-03 DIAGNOSIS — Z23 Encounter for immunization: Secondary | ICD-10-CM | POA: Diagnosis not present

## 2014-06-03 DIAGNOSIS — R634 Abnormal weight loss: Secondary | ICD-10-CM | POA: Diagnosis not present

## 2014-06-03 DIAGNOSIS — Z681 Body mass index (BMI) 19 or less, adult: Secondary | ICD-10-CM | POA: Diagnosis not present

## 2014-06-06 ENCOUNTER — Encounter (INDEPENDENT_AMBULATORY_CARE_PROVIDER_SITE_OTHER): Payer: Self-pay

## 2014-06-06 ENCOUNTER — Ambulatory Visit
Admission: RE | Admit: 2014-06-06 | Discharge: 2014-06-06 | Disposition: A | Discharge status: Home / Self Care | Payer: Medicare Other | Source: Ambulatory Visit | Attending: Internal Medicine | Admitting: Internal Medicine

## 2014-06-06 DIAGNOSIS — R921 Mammographic calcification found on diagnostic imaging of breast: Secondary | ICD-10-CM | POA: Diagnosis not present

## 2014-06-06 DIAGNOSIS — R928 Other abnormal and inconclusive findings on diagnostic imaging of breast: Secondary | ICD-10-CM

## 2014-06-07 DIAGNOSIS — Z1212 Encounter for screening for malignant neoplasm of rectum: Secondary | ICD-10-CM | POA: Diagnosis not present

## 2014-06-09 ENCOUNTER — Other Ambulatory Visit: Payer: Self-pay

## 2014-06-09 DIAGNOSIS — Z85828 Personal history of other malignant neoplasm of skin: Secondary | ICD-10-CM | POA: Diagnosis not present

## 2014-06-09 DIAGNOSIS — D485 Neoplasm of uncertain behavior of skin: Secondary | ICD-10-CM | POA: Diagnosis not present

## 2014-06-09 DIAGNOSIS — C44629 Squamous cell carcinoma of skin of left upper limb, including shoulder: Secondary | ICD-10-CM | POA: Diagnosis not present

## 2014-08-04 DIAGNOSIS — R300 Dysuria: Secondary | ICD-10-CM | POA: Diagnosis not present

## 2014-10-25 DIAGNOSIS — L570 Actinic keratosis: Secondary | ICD-10-CM | POA: Diagnosis not present

## 2014-10-25 DIAGNOSIS — Z85828 Personal history of other malignant neoplasm of skin: Secondary | ICD-10-CM | POA: Diagnosis not present

## 2014-10-25 DIAGNOSIS — L821 Other seborrheic keratosis: Secondary | ICD-10-CM | POA: Diagnosis not present

## 2014-11-29 DIAGNOSIS — H524 Presbyopia: Secondary | ICD-10-CM | POA: Diagnosis not present

## 2014-11-29 DIAGNOSIS — H26493 Other secondary cataract, bilateral: Secondary | ICD-10-CM | POA: Diagnosis not present

## 2014-12-19 ENCOUNTER — Other Ambulatory Visit: Payer: Self-pay | Admitting: Internal Medicine

## 2014-12-19 DIAGNOSIS — R921 Mammographic calcification found on diagnostic imaging of breast: Secondary | ICD-10-CM

## 2014-12-22 ENCOUNTER — Ambulatory Visit
Admission: RE | Admit: 2014-12-22 | Discharge: 2014-12-22 | Disposition: A | Discharge status: Home / Self Care | Payer: Medicare Other | Source: Ambulatory Visit | Attending: Internal Medicine | Admitting: Internal Medicine

## 2014-12-22 DIAGNOSIS — R921 Mammographic calcification found on diagnostic imaging of breast: Secondary | ICD-10-CM

## 2015-02-08 DIAGNOSIS — C44519 Basal cell carcinoma of skin of other part of trunk: Secondary | ICD-10-CM | POA: Diagnosis not present

## 2015-02-08 DIAGNOSIS — D2271 Melanocytic nevi of right lower limb, including hip: Secondary | ICD-10-CM | POA: Diagnosis not present

## 2015-02-08 DIAGNOSIS — L720 Epidermal cyst: Secondary | ICD-10-CM | POA: Diagnosis not present

## 2015-02-08 DIAGNOSIS — D692 Other nonthrombocytopenic purpura: Secondary | ICD-10-CM | POA: Diagnosis not present

## 2015-02-08 DIAGNOSIS — L821 Other seborrheic keratosis: Secondary | ICD-10-CM | POA: Diagnosis not present

## 2015-02-08 DIAGNOSIS — D1801 Hemangioma of skin and subcutaneous tissue: Secondary | ICD-10-CM | POA: Diagnosis not present

## 2015-02-08 DIAGNOSIS — D485 Neoplasm of uncertain behavior of skin: Secondary | ICD-10-CM | POA: Diagnosis not present

## 2015-02-08 DIAGNOSIS — Z85828 Personal history of other malignant neoplasm of skin: Secondary | ICD-10-CM | POA: Diagnosis not present

## 2015-02-08 DIAGNOSIS — L814 Other melanin hyperpigmentation: Secondary | ICD-10-CM | POA: Diagnosis not present

## 2015-02-16 DIAGNOSIS — C44519 Basal cell carcinoma of skin of other part of trunk: Secondary | ICD-10-CM | POA: Diagnosis not present

## 2015-02-16 DIAGNOSIS — Z85828 Personal history of other malignant neoplasm of skin: Secondary | ICD-10-CM | POA: Diagnosis not present

## 2015-04-04 DIAGNOSIS — Z23 Encounter for immunization: Secondary | ICD-10-CM | POA: Diagnosis not present

## 2015-06-02 DIAGNOSIS — E559 Vitamin D deficiency, unspecified: Secondary | ICD-10-CM | POA: Diagnosis not present

## 2015-06-02 DIAGNOSIS — E782 Mixed hyperlipidemia: Secondary | ICD-10-CM | POA: Diagnosis not present

## 2015-06-02 DIAGNOSIS — N390 Urinary tract infection, site not specified: Secondary | ICD-10-CM | POA: Diagnosis not present

## 2015-06-02 DIAGNOSIS — R8299 Other abnormal findings in urine: Secondary | ICD-10-CM | POA: Diagnosis not present

## 2015-06-09 DIAGNOSIS — R2681 Unsteadiness on feet: Secondary | ICD-10-CM | POA: Diagnosis not present

## 2015-06-09 DIAGNOSIS — R3129 Other microscopic hematuria: Secondary | ICD-10-CM | POA: Diagnosis not present

## 2015-06-09 DIAGNOSIS — R0609 Other forms of dyspnea: Secondary | ICD-10-CM | POA: Diagnosis not present

## 2015-06-09 DIAGNOSIS — Z1389 Encounter for screening for other disorder: Secondary | ICD-10-CM | POA: Diagnosis not present

## 2015-06-09 DIAGNOSIS — Z6820 Body mass index (BMI) 20.0-20.9, adult: Secondary | ICD-10-CM | POA: Diagnosis not present

## 2015-06-09 DIAGNOSIS — E782 Mixed hyperlipidemia: Secondary | ICD-10-CM | POA: Diagnosis not present

## 2015-06-09 DIAGNOSIS — F17200 Nicotine dependence, unspecified, uncomplicated: Secondary | ICD-10-CM | POA: Diagnosis not present

## 2015-06-09 DIAGNOSIS — E559 Vitamin D deficiency, unspecified: Secondary | ICD-10-CM | POA: Diagnosis not present

## 2015-06-09 DIAGNOSIS — Z0000 Encounter for general adult medical examination without abnormal findings: Secondary | ICD-10-CM | POA: Diagnosis not present

## 2015-06-09 DIAGNOSIS — M810 Age-related osteoporosis without current pathological fracture: Secondary | ICD-10-CM | POA: Diagnosis not present

## 2015-06-12 DIAGNOSIS — Z1212 Encounter for screening for malignant neoplasm of rectum: Secondary | ICD-10-CM | POA: Diagnosis not present

## 2015-10-23 ENCOUNTER — Other Ambulatory Visit: Payer: Self-pay | Admitting: Internal Medicine

## 2015-10-23 DIAGNOSIS — R921 Mammographic calcification found on diagnostic imaging of breast: Secondary | ICD-10-CM

## 2015-10-30 ENCOUNTER — Ambulatory Visit
Admission: RE | Admit: 2015-10-30 | Discharge: 2015-10-30 | Disposition: A | Discharge status: Home / Self Care | Payer: Medicare Other | Source: Ambulatory Visit | Attending: Internal Medicine | Admitting: Internal Medicine

## 2015-10-30 DIAGNOSIS — R921 Mammographic calcification found on diagnostic imaging of breast: Secondary | ICD-10-CM

## 2016-01-09 DIAGNOSIS — H01004 Unspecified blepharitis left upper eyelid: Secondary | ICD-10-CM | POA: Diagnosis not present

## 2016-01-09 DIAGNOSIS — H01005 Unspecified blepharitis left lower eyelid: Secondary | ICD-10-CM | POA: Diagnosis not present

## 2016-01-09 DIAGNOSIS — H26493 Other secondary cataract, bilateral: Secondary | ICD-10-CM | POA: Diagnosis not present

## 2016-01-09 DIAGNOSIS — H1132 Conjunctival hemorrhage, left eye: Secondary | ICD-10-CM | POA: Diagnosis not present

## 2016-01-19 DIAGNOSIS — H5202 Hypermetropia, left eye: Secondary | ICD-10-CM | POA: Diagnosis not present

## 2016-01-19 DIAGNOSIS — H1132 Conjunctival hemorrhage, left eye: Secondary | ICD-10-CM | POA: Diagnosis not present

## 2016-01-19 DIAGNOSIS — H52222 Regular astigmatism, left eye: Secondary | ICD-10-CM | POA: Diagnosis not present

## 2016-01-19 DIAGNOSIS — H52221 Regular astigmatism, right eye: Secondary | ICD-10-CM | POA: Diagnosis not present

## 2016-01-19 DIAGNOSIS — H01005 Unspecified blepharitis left lower eyelid: Secondary | ICD-10-CM | POA: Diagnosis not present

## 2016-01-19 DIAGNOSIS — Z961 Presence of intraocular lens: Secondary | ICD-10-CM | POA: Diagnosis not present

## 2016-01-19 DIAGNOSIS — H01004 Unspecified blepharitis left upper eyelid: Secondary | ICD-10-CM | POA: Diagnosis not present

## 2016-01-19 DIAGNOSIS — D2312 Other benign neoplasm of skin of left eyelid, including canthus: Secondary | ICD-10-CM | POA: Diagnosis not present

## 2016-02-08 DIAGNOSIS — L720 Epidermal cyst: Secondary | ICD-10-CM | POA: Diagnosis not present

## 2016-02-08 DIAGNOSIS — L821 Other seborrheic keratosis: Secondary | ICD-10-CM | POA: Diagnosis not present

## 2016-02-08 DIAGNOSIS — Z85828 Personal history of other malignant neoplasm of skin: Secondary | ICD-10-CM | POA: Diagnosis not present

## 2016-02-08 DIAGNOSIS — D1801 Hemangioma of skin and subcutaneous tissue: Secondary | ICD-10-CM | POA: Diagnosis not present

## 2016-02-08 DIAGNOSIS — L570 Actinic keratosis: Secondary | ICD-10-CM | POA: Diagnosis not present

## 2016-06-06 DIAGNOSIS — E782 Mixed hyperlipidemia: Secondary | ICD-10-CM | POA: Diagnosis not present

## 2016-06-06 DIAGNOSIS — R8299 Other abnormal findings in urine: Secondary | ICD-10-CM | POA: Diagnosis not present

## 2016-06-06 DIAGNOSIS — N390 Urinary tract infection, site not specified: Secondary | ICD-10-CM | POA: Diagnosis not present

## 2016-06-06 DIAGNOSIS — E559 Vitamin D deficiency, unspecified: Secondary | ICD-10-CM | POA: Diagnosis not present

## 2016-06-13 DIAGNOSIS — R3129 Other microscopic hematuria: Secondary | ICD-10-CM | POA: Diagnosis not present

## 2016-06-13 DIAGNOSIS — M810 Age-related osteoporosis without current pathological fracture: Secondary | ICD-10-CM | POA: Diagnosis not present

## 2016-06-13 DIAGNOSIS — Z6820 Body mass index (BMI) 20.0-20.9, adult: Secondary | ICD-10-CM | POA: Diagnosis not present

## 2016-06-13 DIAGNOSIS — E559 Vitamin D deficiency, unspecified: Secondary | ICD-10-CM | POA: Diagnosis not present

## 2016-06-13 DIAGNOSIS — H04129 Dry eye syndrome of unspecified lacrimal gland: Secondary | ICD-10-CM | POA: Diagnosis not present

## 2016-06-13 DIAGNOSIS — J438 Other emphysema: Secondary | ICD-10-CM | POA: Diagnosis not present

## 2016-06-13 DIAGNOSIS — R634 Abnormal weight loss: Secondary | ICD-10-CM | POA: Diagnosis not present

## 2016-06-13 DIAGNOSIS — F17200 Nicotine dependence, unspecified, uncomplicated: Secondary | ICD-10-CM | POA: Diagnosis not present

## 2016-06-13 DIAGNOSIS — Z1389 Encounter for screening for other disorder: Secondary | ICD-10-CM | POA: Diagnosis not present

## 2016-06-13 DIAGNOSIS — Z0000 Encounter for general adult medical examination without abnormal findings: Secondary | ICD-10-CM | POA: Diagnosis not present

## 2016-11-07 ENCOUNTER — Encounter (HOSPITAL_COMMUNITY): Payer: Self-pay | Admitting: *Deleted

## 2016-11-07 ENCOUNTER — Emergency Department (HOSPITAL_COMMUNITY)
Admission: EM | Admit: 2016-11-07 | Discharge: 2016-11-07 | Disposition: A | Discharge status: Home / Self Care | Payer: Medicare Other | Attending: Emergency Medicine | Admitting: Emergency Medicine

## 2016-11-07 ENCOUNTER — Emergency Department (HOSPITAL_COMMUNITY): Payer: Medicare Other

## 2016-11-07 DIAGNOSIS — I10 Essential (primary) hypertension: Secondary | ICD-10-CM | POA: Diagnosis not present

## 2016-11-07 DIAGNOSIS — Z79899 Other long term (current) drug therapy: Secondary | ICD-10-CM | POA: Insufficient documentation

## 2016-11-07 DIAGNOSIS — R109 Unspecified abdominal pain: Secondary | ICD-10-CM | POA: Diagnosis not present

## 2016-11-07 DIAGNOSIS — R112 Nausea with vomiting, unspecified: Secondary | ICD-10-CM | POA: Diagnosis not present

## 2016-11-07 DIAGNOSIS — F17210 Nicotine dependence, cigarettes, uncomplicated: Secondary | ICD-10-CM | POA: Insufficient documentation

## 2016-11-07 DIAGNOSIS — R05 Cough: Secondary | ICD-10-CM | POA: Diagnosis not present

## 2016-11-07 DIAGNOSIS — R42 Dizziness and giddiness: Secondary | ICD-10-CM | POA: Diagnosis not present

## 2016-11-07 DIAGNOSIS — K2970 Gastritis, unspecified, without bleeding: Secondary | ICD-10-CM | POA: Diagnosis not present

## 2016-11-07 LAB — URINALYSIS, ROUTINE W REFLEX MICROSCOPIC
BILIRUBIN URINE: NEGATIVE
Glucose, UA: NEGATIVE mg/dL
Ketones, ur: 5 mg/dL — AB
NITRITE: POSITIVE — AB
PH: 6 (ref 5.0–8.0)
Protein, ur: NEGATIVE mg/dL
SPECIFIC GRAVITY, URINE: 1.018 (ref 1.005–1.030)

## 2016-11-07 LAB — CBC
HCT: 47.6 % — ABNORMAL HIGH (ref 36.0–46.0)
Hemoglobin: 16.1 g/dL — ABNORMAL HIGH (ref 12.0–15.0)
MCH: 32.2 pg (ref 26.0–34.0)
MCHC: 33.8 g/dL (ref 30.0–36.0)
MCV: 95.2 fL (ref 78.0–100.0)
PLATELETS: 252 10*3/uL (ref 150–400)
RBC: 5 MIL/uL (ref 3.87–5.11)
RDW: 13 % (ref 11.5–15.5)
WBC: 16 10*3/uL — ABNORMAL HIGH (ref 4.0–10.5)

## 2016-11-07 LAB — HEPATIC FUNCTION PANEL
ALT: 14 U/L (ref 14–54)
AST: 18 U/L (ref 15–41)
Albumin: 4.1 g/dL (ref 3.5–5.0)
Alkaline Phosphatase: 89 U/L (ref 38–126)
BILIRUBIN DIRECT: 0.1 mg/dL (ref 0.1–0.5)
BILIRUBIN INDIRECT: 0.4 mg/dL (ref 0.3–0.9)
Total Bilirubin: 0.5 mg/dL (ref 0.3–1.2)
Total Protein: 7 g/dL (ref 6.5–8.1)

## 2016-11-07 LAB — BASIC METABOLIC PANEL
Anion gap: 9 (ref 5–15)
BUN: 16 mg/dL (ref 6–20)
CO2: 24 mmol/L (ref 22–32)
CREATININE: 1.01 mg/dL — AB (ref 0.44–1.00)
Calcium: 9.5 mg/dL (ref 8.9–10.3)
Chloride: 107 mmol/L (ref 101–111)
GFR calc Af Amer: >60 mL/min (ref >60)
GFR, EST NON AFRICAN AMERICAN: 53 mL/min — AB (ref >60)
GLUCOSE: 163 mg/dL — AB (ref 65–99)
Potassium: 4.4 mmol/L (ref 3.5–5.1)
Sodium: 140 mmol/L (ref 135–145)

## 2016-11-07 LAB — TROPONIN I

## 2016-11-07 LAB — LIPASE, BLOOD: Lipase: 20 U/L (ref 11–51)

## 2016-11-07 MED ORDER — ONDANSETRON 4 MG PO TBDP: 4.0000 mg | ORAL_TABLET | Freq: Three times a day (TID) | ORAL | 0 refills | Status: DC | PRN

## 2016-11-07 MED ORDER — ONDANSETRON 4 MG PO TBDP
4.0000 mg | ORAL_TABLET | Freq: Once | ORAL | Status: AC
  Administered 2016-11-07: 4 mg via ORAL
  Filled 2016-11-07: qty 1

## 2016-11-07 MED ORDER — SODIUM CHLORIDE 0.9 % IV BOLUS (SEPSIS)
500.0000 mL | Freq: Once | INTRAVENOUS | Status: AC
  Administered 2016-11-07: 500 mL via INTRAVENOUS

## 2016-11-07 MED ORDER — ONDANSETRON HCL 4 MG/2ML IJ SOLN
4.0000 mg | Freq: Once | INTRAMUSCULAR | Status: AC
  Administered 2016-11-07: 4 mg via INTRAVENOUS

## 2016-11-07 NOTE — ED Notes (Signed)
Hooked patient back up to the monitor patient is resting

## 2016-11-07 NOTE — ED Provider Notes (Signed)
[facility] DEPT Provider Note   CSN: [id number] Arrival date & time: 11/07/16  1016     History   Chief Complaint Chief Complaint  Patient presents with  . Flank Pain  . Emesis    HPI Alyssa Howard is a 76 y.o. female.  HPI Patient presents with left flank pain. Nausea and vomiting. States she woke up like it. Also feels a little dizzy. Feels like she is drunk, the room is spinning and like she is going to pass out. Has had these on and off for a while now. No fevers. States she has been bringing up yellow sputum that she thinks comes from her lungs. She does however states she's been vomiting it. No diarrhea. No dysuria. No fevers. No headache. No confusion. States she just feels bad all over.   Past Medical History:  Diagnosis Date  . Hypercholesterolemia   . Hypertension   . Osteopenia   . Syncope and collapse     Patient Active Problem List   Diagnosis Date Noted  . HYPERLIPIDEMIA 01/01/2007  . TOBACCO USER 01/01/2007    Past Surgical History:  Procedure Laterality Date  . CATARACT EXTRACTION      OB History    No data available       Home Medications    Prior to Admission medications   Medication Sig Start Date End Date Taking? Authorizing Provider  atenolol (TENORMIN) 25 MG tablet Take 25 mg by mouth daily.    [provider]  calcium carbonate 1250 MG capsule Take 1,250 mg by mouth daily.    [provider]  fish oil-omega-3 fatty acids 1000 MG capsule Take 1 g by mouth daily.    [provider]  ondansetron (ZOFRAN-ODT) 4 MG disintegrating tablet Take 1 tablet (4 mg total) by mouth every 8 (eight) hours as needed for nausea or vomiting. 11/07/16   Davonna Belling, MD    Family History Family History  Problem Relation Age of Onset  . Diabetes Mother   . Stroke Mother   . Melanoma Sister   . Heart disease Brother   . Alzheimer's disease Brother     Social History Social History  Substance Use Topics  .  Smoking status: Current Every Day Smoker    Packs/day: 0.50    Types: Cigarettes  . Smokeless tobacco: Never Used  . Alcohol use No     Allergies   Penicillins and Zetia [ezetimibe]   Review of Systems Review of Systems  Constitutional: Positive for appetite change. Negative for fever.  Respiratory: Positive for cough.   Gastrointestinal: Positive for abdominal pain, nausea and vomiting.  Genitourinary: Positive for flank pain.  Musculoskeletal: Negative for back pain.  Skin: Negative for rash and wound.  Neurological: Positive for dizziness and light-headedness.  Hematological: Negative for adenopathy.  Psychiatric/Behavioral: Negative for confusion.     Physical Exam Updated Vital Signs BP (!) 170/70   Pulse 68   Temp 97.8 F (36.6 C) (Oral)   Resp 19   SpO2 97%   Physical Exam  Constitutional: She appears well-developed.  HENT:  Head: Atraumatic.  Eyes: Pupils are equal, round, and reactive to light.  Horizontal nystagmus with gaze to left and right. Pupils intact. Eye movements otherwise intact.  Neck: Neck supple.  Cardiovascular: Normal rate.   Pulmonary/Chest: Effort normal.  Mildly harsh breath sounds at left base  Abdominal: Soft. There is no tenderness.  Genitourinary:  Genitourinary Comments: Mild CVA tenderness on left side.  Musculoskeletal: She  exhibits no edema.  Neurological: She is alert.  Face symmetric. Finger-nose intact bilaterally. Good dressing bilaterally. Moving all extremities and is awake and appropriate.  Skin: Skin is warm. Capillary refill takes less than 2 seconds.  Psychiatric: She has a normal mood and affect.     ED Treatments / Results  Labs (all labs ordered are listed, but only abnormal results are displayed) Labs Reviewed  URINALYSIS, ROUTINE W REFLEX MICROSCOPIC - Abnormal; Notable for the following:       Result Value   Hgb urine dipstick MODERATE (*)    Ketones, ur 5 (*)    Nitrite POSITIVE (*)    Leukocytes, UA  TRACE (*)    Bacteria, UA MANY (*)    Squamous Epithelial / LPF 0-5 (*)    All other components within normal limits  CBC - Abnormal; Notable for the following:    WBC 16.0 (*)    Hemoglobin 16.1 (*)    HCT 47.6 (*)    All other components within normal limits  BASIC METABOLIC PANEL - Abnormal; Notable for the following:    Glucose, Bld 163 (*)    Creatinine, Ser 1.01 (*)    GFR calc non Af Amer 53 (*)    All other components within normal limits  URINE CULTURE  LIPASE, BLOOD  HEPATIC FUNCTION PANEL  TROPONIN I    EKG  EKG Interpretation None       Radiology Dg Chest 2 View  Result Date: 11/07/2016 CLINICAL DATA:  Cough and emesis since early morning ,,,very weak EXAM: CHEST  2 VIEW COMPARISON:  08/08/2009 FINDINGS: Heart size is accentuated by pectus excavatum. There are no focal consolidations or pleural effusions. No pulmonary edema. IMPRESSION: No evidence for acute cardiopulmonary abnormality. Electronically Signed   By: Nolon Nations M.D.   On: 11/07/2016 11:10    Procedures Procedures (including critical care time)  Medications Ordered in ED Medications  ondansetron (ZOFRAN) injection 4 mg (4 mg Intravenous Given 11/07/16 1049)  sodium chloride 0.9 % bolus 500 mL (0 mLs Intravenous Stopped 11/07/16 1217)  ondansetron (ZOFRAN-ODT) disintegrating tablet 4 mg (4 mg Oral Given 11/07/16 1330)     Initial Impression / Assessment and Plan / ED Course  I have reviewed the triage vital signs and the nursing notes.  Pertinent labs & imaging results that were available during my care of the patient were reviewed by me and considered in my medical decision making (see chart for details).     Patient with left flank pain. Nausea. Urine does not show clear infection. Has had aches. Reportedly has been coughing up sputum with her vomiting. X-ray reassuring. Discussed with patient possibly getting a CT scan but patient does not want one at this time. Labs reassuring. Will  discharge home with antiemetic and will follow-up as needed.  Final Clinical Impressions(s) / ED Diagnoses   Final diagnoses:  Non-intractable vomiting with nausea, unspecified vomiting type  Flank pain    New Prescriptions Discharge Medication List as of 11/07/2016  1:25 PM    START taking these medications   Details  ondansetron (ZOFRAN-ODT) 4 MG disintegrating tablet Take 1 tablet (4 mg total) by mouth every 8 (eight) hours as needed for nausea or vomiting., Starting Thu 11/07/2016, Print         Davonna Belling, MD 11/07/16 [phone]

## 2016-11-07 NOTE — ED Notes (Signed)
Pt is in stable condition upon d/c and is escorted from ED via wheelchair. 

## 2016-11-07 NOTE — ED Triage Notes (Signed)
Pt arrives from home with c/o right flank pain, nausea and vomiting. Pt states she woke up like this today and has had "dizzy spells" intermittently for "a while". Pt received approx. 89ml NS PTA.

## 2016-11-07 NOTE — ED Notes (Signed)
Got patient on monitor the patient is resting with family at bedside

## 2016-11-09 ENCOUNTER — Emergency Department (HOSPITAL_COMMUNITY): Payer: Medicare Other

## 2016-11-09 ENCOUNTER — Encounter (HOSPITAL_COMMUNITY): Payer: Self-pay | Admitting: Emergency Medicine

## 2016-11-09 ENCOUNTER — Emergency Department (HOSPITAL_COMMUNITY)
Admission: EM | Admit: 2016-11-09 | Discharge: 2016-11-09 | Disposition: A | Discharge status: Home / Self Care | Payer: Medicare Other | Attending: Emergency Medicine | Admitting: Emergency Medicine

## 2016-11-09 DIAGNOSIS — I10 Essential (primary) hypertension: Secondary | ICD-10-CM | POA: Insufficient documentation

## 2016-11-09 DIAGNOSIS — N390 Urinary tract infection, site not specified: Secondary | ICD-10-CM | POA: Insufficient documentation

## 2016-11-09 DIAGNOSIS — R109 Unspecified abdominal pain: Secondary | ICD-10-CM

## 2016-11-09 DIAGNOSIS — N210 Calculus in bladder: Secondary | ICD-10-CM | POA: Diagnosis not present

## 2016-11-09 DIAGNOSIS — N200 Calculus of kidney: Secondary | ICD-10-CM | POA: Diagnosis not present

## 2016-11-09 DIAGNOSIS — F17210 Nicotine dependence, cigarettes, uncomplicated: Secondary | ICD-10-CM | POA: Insufficient documentation

## 2016-11-09 DIAGNOSIS — E860 Dehydration: Secondary | ICD-10-CM | POA: Insufficient documentation

## 2016-11-09 DIAGNOSIS — N281 Cyst of kidney, acquired: Secondary | ICD-10-CM | POA: Diagnosis not present

## 2016-11-09 DIAGNOSIS — Z79899 Other long term (current) drug therapy: Secondary | ICD-10-CM | POA: Insufficient documentation

## 2016-11-09 LAB — URINALYSIS, ROUTINE W REFLEX MICROSCOPIC
Bilirubin Urine: NEGATIVE
GLUCOSE, UA: NEGATIVE mg/dL
Ketones, ur: 20 mg/dL — AB
Nitrite: NEGATIVE
PROTEIN: 100 mg/dL — AB
Specific Gravity, Urine: 1.027 (ref 1.005–1.030)
pH: 5 (ref 5.0–8.0)

## 2016-11-09 LAB — COMPREHENSIVE METABOLIC PANEL
ALT: 14 U/L (ref 14–54)
AST: 20 U/L (ref 15–41)
Albumin: 3.8 g/dL (ref 3.5–5.0)
Alkaline Phosphatase: 89 U/L (ref 38–126)
Anion gap: 12 (ref 5–15)
BUN: 22 mg/dL — AB (ref 6–20)
CO2: 22 mmol/L (ref 22–32)
CREATININE: 1.14 mg/dL — AB (ref 0.44–1.00)
Calcium: 9.5 mg/dL (ref 8.9–10.3)
Chloride: 98 mmol/L — ABNORMAL LOW (ref 101–111)
GFR calc Af Amer: 53 mL/min — ABNORMAL LOW (ref >60)
GFR calc non Af Amer: 46 mL/min — ABNORMAL LOW (ref >60)
Glucose, Bld: 106 mg/dL — ABNORMAL HIGH (ref 65–99)
Potassium: 4 mmol/L (ref 3.5–5.1)
SODIUM: 132 mmol/L — AB (ref 135–145)
Total Bilirubin: 1.8 mg/dL — ABNORMAL HIGH (ref 0.3–1.2)
Total Protein: 7.3 g/dL (ref 6.5–8.1)

## 2016-11-09 LAB — CBC WITH DIFFERENTIAL/PLATELET
BASOS ABS: 0 10*3/uL (ref 0.0–0.1)
BASOS PCT: 0 %
EOS ABS: 0 10*3/uL (ref 0.0–0.7)
EOS PCT: 0 %
HEMATOCRIT: 46.6 % — AB (ref 36.0–46.0)
Hemoglobin: 15.9 g/dL — ABNORMAL HIGH (ref 12.0–15.0)
Lymphocytes Relative: 7 %
Lymphs Abs: 1.3 10*3/uL (ref 0.7–4.0)
MCH: 31.8 pg (ref 26.0–34.0)
MCHC: 34.1 g/dL (ref 30.0–36.0)
MCV: 93.2 fL (ref 78.0–100.0)
MONO ABS: 1.8 10*3/uL — AB (ref 0.1–1.0)
MONOS PCT: 9 %
Neutro Abs: 16.7 10*3/uL — ABNORMAL HIGH (ref 1.7–7.7)
Neutrophils Relative %: 84 %
Platelets: 303 10*3/uL (ref 150–400)
RBC: 5 MIL/uL (ref 3.87–5.11)
RDW: 12.6 % (ref 11.5–15.5)
WBC: 19.8 10*3/uL — ABNORMAL HIGH (ref 4.0–10.5)

## 2016-11-09 LAB — URINE CULTURE

## 2016-11-09 LAB — I-STAT CG4 LACTIC ACID, ED: LACTIC ACID, VENOUS: 1.52 mmol/L (ref 0.5–1.9)

## 2016-11-09 MED ORDER — ONDANSETRON HCL 4 MG/2ML IJ SOLN
4.0000 mg | Freq: Once | INTRAMUSCULAR | Status: AC
  Administered 2016-11-09: 4 mg via INTRAVENOUS
  Filled 2016-11-09: qty 2

## 2016-11-09 MED ORDER — CEFTRIAXONE SODIUM 1 G IJ SOLR
1.0000 g | Freq: Once | INTRAMUSCULAR | Status: AC
  Administered 2016-11-09: 1 g via INTRAVENOUS
  Filled 2016-11-09: qty 10

## 2016-11-09 MED ORDER — CIPROFLOXACIN HCL 500 MG PO TABS: 500.0000 mg | ORAL_TABLET | Freq: Two times a day (BID) | ORAL | 0 refills | Status: DC

## 2016-11-09 MED ORDER — PROMETHAZINE HCL 12.5 MG PO TABS: 12.5000 mg | ORAL_TABLET | Freq: Three times a day (TID) | ORAL | 0 refills | Status: DC | PRN

## 2016-11-09 MED ORDER — FENTANYL CITRATE (PF) 100 MCG/2ML IJ SOLN
25.0000 ug | Freq: Once | INTRAMUSCULAR | Status: AC
  Administered 2016-11-09: 25 ug via INTRAVENOUS
  Filled 2016-11-09: qty 2

## 2016-11-09 MED ORDER — HYDROCODONE-ACETAMINOPHEN 5-325 MG PO TABS: 0.5000 | ORAL_TABLET | Freq: Three times a day (TID) | ORAL | 0 refills | Status: DC | PRN

## 2016-11-09 MED ORDER — PROMETHAZINE HCL 25 MG/ML IJ SOLN
12.5000 mg | Freq: Once | INTRAMUSCULAR | Status: AC
  Administered 2016-11-09: 12.5 mg via INTRAVENOUS
  Filled 2016-11-09: qty 1

## 2016-11-09 MED ORDER — SODIUM CHLORIDE 0.9 % IV BOLUS (SEPSIS)
1000.0000 mL | Freq: Once | INTRAVENOUS | Status: AC
  Administered 2016-11-09: 1000 mL via INTRAVENOUS

## 2016-11-09 NOTE — ED Notes (Signed)
Pt to restroom with this RN via wheelchair, pt very weak/dizzy/unsteady. RN remained with pt entire time

## 2016-11-09 NOTE — Discharge Instructions (Signed)
Return here as needed.  Follow-up with [facility] and your primary doctor.  Slowly increase your fluid intake

## 2016-11-09 NOTE — ED Triage Notes (Signed)
Pt c/o flank pain and continued nausea, unable to eat or drink anything without vomiting, was seen here on 11/09/16. Now has burning on urination.

## 2016-11-09 NOTE — ED Notes (Signed)
Pt ambulated in hall with steady gait. Pt reports feeling dizzy and not feeling well while ambulating.

## 2016-11-09 NOTE — ED Notes (Signed)
Chris PA at bedside   

## 2016-11-09 NOTE — ED Notes (Signed)
Pt stable, understands discharge instructions, and reasons for return.   

## 2016-11-10 ENCOUNTER — Telehealth: Payer: Self-pay

## 2016-11-10 NOTE — Telephone Encounter (Signed)
Post ED Visit - Positive Culture Follow-up  Culture report reviewed by antimicrobial stewardship pharmacist:  []  Elenor Quinones, Pharm.D. []  Heide Guile, Pharm.D., BCPS AQ-ID []  Parks Neptune, Pharm.D., BCPS []  Alycia Rossetti, Pharm.D., BCPS []  [place], Pharm.D., BCPS, AAHIVP [x]  Legrand Como, Pharm.D., BCPS, AAHIVP []  Salome Arnt, PharmD, BCPS []  Dimitri Ped, PharmD, BCPS []  Vincenza Hews, PharmD, BCPS  Positive urine culture Treated with Cipro, organism sensitive to the same and no further patient follow-up is required at this time.  Genia Del 11/10/2016, 9:46 AM

## 2016-11-11 NOTE — ED Provider Notes (Signed)
[facility] DEPT Provider Note   CSN: [id number] Arrival date & time: 11/09/16  1340     History   Chief Complaint Chief Complaint  Patient presents with  . Flank Pain  . Dysuria  . Emesis    Alyssa Howard is a 76 y.o. female.  Alyssa Patient presents to the emergency department with continued nausea with flank pain.  The patient states that she was seen 2 days ago with nausea, vomiting and flank pain.  Patient states that that time.  She was given medications and discharged home.  Husband states that she has not been able to take down any fluids or food in the interim, the patient states that the thought of food makes her want to vomit.  The patient states that every time she felt like she drinks something, she vomited.  She states she has not vomited since yesterday.  The patient states that she did not take the medications that she was prescribed and he did seem to help with her symptoms.  She states she is still continuing with significant flank pain on the left. The patient denies chest pain, shortness of breath, headache,blurred vision, neck pain, fever, cough, weakness, numbness, dizziness, anorexia, edema,  diarrhea, rash, back pain, dysuria, hematemesis, bloody stool, near syncope, or syncope. Past Medical History:  Diagnosis Date  . Hypercholesterolemia   . Hypertension   . Osteopenia   . Syncope and collapse     Patient Active Problem List   Diagnosis Date Noted  . HYPERLIPIDEMIA 01/01/2007  . TOBACCO USER 01/01/2007    Past Surgical History:  Procedure Laterality Date  . CATARACT EXTRACTION      OB History    No data available       Home Medications    Prior to Admission medications   Medication Sig Start Date End Date Taking? Authorizing Provider  atenolol (TENORMIN) 25 MG tablet Take 25 mg by mouth daily.    [provider]  calcium carbonate 1250 MG capsule Take 1,250 mg by mouth daily.    [provider]  ciprofloxacin  (CIPRO) 500 MG tablet Take 1 tablet (500 mg total) by mouth 2 (two) times daily. 11/09/16   Jersi Mcmaster, Harrell Gave, PA-C  fish oil-omega-3 fatty acids 1000 MG capsule Take 1 g by mouth daily.    [provider]  HYDROcodone-acetaminophen (NORCO/VICODIN) 5-325 MG tablet Take 0.5 tablets by mouth every 8 (eight) hours as needed for moderate pain. 11/09/16   Harleen Fineberg, Harrell Gave, PA-C  ondansetron (ZOFRAN-ODT) 4 MG disintegrating tablet Take 1 tablet (4 mg total) by mouth every 8 (eight) hours as needed for nausea or vomiting. 11/07/16   Davonna Belling, MD  promethazine (PHENERGAN) 12.5 MG tablet Take 1 tablet (12.5 mg total) by mouth every 8 (eight) hours as needed for nausea or vomiting. 11/09/16   Dalia Heading, PA-C    Family History Family History  Problem Relation Age of Onset  . Diabetes Mother   . Stroke Mother   . Melanoma Sister   . Heart disease Brother   . Alzheimer's disease Brother     Social History Social History  Substance Use Topics  . Smoking status: Current Every Day Smoker    Packs/day: 0.50    Types: Cigarettes  . Smokeless tobacco: Never Used  . Alcohol use No     Allergies   Penicillins and Zetia [ezetimibe]   Review of Systems Review of Systems All other systems negative except as documented in the Alyssa. All pertinent  positives and negatives as reviewed in the Alyssa.  Physical Exam Updated Vital Signs BP (!) 147/72   Pulse 77   Temp 99 F (37.2 C) (Oral)   Resp 14   Ht 5\' 6"  (1.676 m)   Wt 120 lb (54.4 kg)   SpO2 97%   BMI 19.37 kg/m   Physical Exam  Constitutional: She is oriented to person, place, and time. She appears well-developed and well-nourished. No distress.  HENT:  Head: Normocephalic and atraumatic.  Mouth/Throat: Oropharynx is clear and moist.  Eyes: Pupils are equal, round, and reactive to light.  Neck: Normal range of motion. Neck supple.  Cardiovascular: Normal rate, regular rhythm and normal heart sounds.  Exam  reveals no gallop and no friction rub.   No murmur heard. Pulmonary/Chest: Effort normal and breath sounds normal. No respiratory distress. She has no wheezes.  Abdominal: Soft. Bowel sounds are normal. She exhibits no distension. There is no tenderness.  Neurological: She is alert and oriented to person, place, and time. She exhibits normal muscle tone. Coordination normal.  Skin: Skin is warm and dry. Capillary refill takes less than 2 seconds. No rash noted. No erythema.  Psychiatric: She has a normal mood and affect. Her behavior is normal.  Nursing note and vitals reviewed.    ED Treatments / Results  Labs (all labs ordered are listed, but only abnormal results are displayed) Labs Reviewed  COMPREHENSIVE METABOLIC PANEL - Abnormal; Notable for the following:       Result Value   Sodium 132 (*)    Chloride 98 (*)    Glucose, Bld 106 (*)    BUN 22 (*)    Creatinine, Ser 1.14 (*)    Total Bilirubin 1.8 (*)    GFR calc non Af Amer 46 (*)    GFR calc Af Amer 53 (*)    All other components within normal limits  CBC WITH DIFFERENTIAL/PLATELET - Abnormal; Notable for the following:    WBC 19.8 (*)    Hemoglobin 15.9 (*)    HCT 46.6 (*)    Neutro Abs 16.7 (*)    Monocytes Absolute 1.8 (*)    All other components within normal limits  URINALYSIS, ROUTINE W REFLEX MICROSCOPIC - Abnormal; Notable for the following:    Color, Urine AMBER (*)    APPearance HAZY (*)    Hgb urine dipstick LARGE (*)    Ketones, ur 20 (*)    Protein, ur 100 (*)    Leukocytes, UA SMALL (*)    Bacteria, UA RARE (*)    Squamous Epithelial / LPF 0-5 (*)    All other components within normal limits  URINE CULTURE  I-STAT CG4 LACTIC ACID, ED  I-STAT CG4 LACTIC ACID, ED    EKG  EKG Interpretation None       Radiology Dg Chest 2 View  Result Date: 11/09/2016 CLINICAL DATA:  Flank pain.  Nausea.  This area. EXAM: CHEST  2 VIEW COMPARISON:  11/07/2016 FINDINGS: The heart is borderline enlarged.  Lungs are hyperaerated and clear. No pneumothorax or pleural effusion. Osteopenia. Stable thoracic spine. IMPRESSION: No active cardiopulmonary disease. Electronically Signed   By: Marybelle Killings M.D.   On: 11/09/2016 14:55   Ct Renal Stone Study  Result Date: 11/09/2016 CLINICAL DATA:  Flank pain with nausea.  Burning urination. EXAM: CT ABDOMEN AND PELVIS WITHOUT CONTRAST TECHNIQUE: Multidetector CT imaging of the abdomen and pelvis was performed following the standard protocol without IV contrast. COMPARISON:  06/10/2012  FINDINGS: Lower chest: Lung bases are normal. Mild pectus excavatum. Calcified plaque over the descending thoracic aorta. Hepatobiliary: Within normal. Pancreas: Within normal. Spleen: Within normal. Adrenals/Urinary Tract: Adrenal glands are within normal. Kidneys are normal in size without nephrolithiasis. Stable 1.8 cm cyst with minimal rim calcification over the mid to lower pole right kidney. Mild left-sided hydronephrosis. Minimal stranding of the left perinephric fat. Mild prominence of the left ureter with 1- 2 mm stone just inside the bladder at the left UVJ. Right ureter is normal. Stomach/Bowel: Stomach and small bowel are within normal. Appendix is normal. Colon is unremarkable. Vascular/Lymphatic: Calcified plaque over the abdominal aorta and iliac arteries. No adenopathy. Reproductive: Unremarkable. Other: No free fluid. Musculoskeletal: Mild degenerate change the spine with moderate disc disease at the L2-3 level. Mild degenerate change of the hips. IMPRESSION: 1-2 mm stone just inside the bladder at the left UVJ with mild dilatation of the left collecting system. Stable Bosniak II 1.8 cm right renal cyst since 2013. Aortic atherosclerosis. Electronically Signed   By: Marin Olp M.D.   On: 11/09/2016 19:10    Procedures Procedures (including critical care time)  Medications Ordered in ED Medications  sodium chloride 0.9 % bolus 1,000 mL (0 mLs Intravenous Stopped  11/09/16 1958)  ondansetron (ZOFRAN) injection 4 mg (4 mg Intravenous Given 11/09/16 1729)  promethazine (PHENERGAN) injection 12.5 mg (12.5 mg Intravenous Given 11/09/16 1957)  fentaNYL (SUBLIMAZE) injection 25 mcg (25 mcg Intravenous Given 11/09/16 1958)  cefTRIAXone (ROCEPHIN) 1 g in dextrose 5 % 50 mL IVPB (0 g Intravenous Stopped 11/09/16 2244)     Initial Impression / Assessment and Plan / ED Course  I have reviewed the triage vital signs and the nursing notes.  Pertinent labs & imaging results that were available during my care of the patient were reviewed by me and considered in my medical decision making (see chart for details).     The patient had a protracted ER visit.  Due to the fact that the family was uncomfortable with this severely taking the patient home until she was able to tolerate oral fluids.  I had a very long discussion with the family and the patient about the hydration status and the IV fluids were administered.  I advised the patient that at this time.  Her laboratory testing does not show any significant abnormalities.  She does have a small bump in her creatinine.  She was given a liter of fluids here in the emergency department.  The patient is feeling some better.  She was able emulate without difficulties.  She also was able tolerate oral fluids.  She did urinate several times and feels like her urine has gotten lighter and lighter.  She states that she is actually able to urinate without any difficulty.  At this time.  She states she was diffusely having difficulty with urination.  I advised the family on why I felt that the patient could be discharged home.  I did advise them that if hey felt like she was not able to go home we could speak with the admitting doctors, but at this point, I feel that she can be discharged.  The patient has been stable here in the emergency department.  The husband and son are both in agreement to take her home.  I did give strict return  precautions.  They advised that they will bring the patient back if she is unable to stay at home and successfully eat and drink.  I  did advise follow-up with urology and her primary care doctor. Final Clinical Impressions(s) / ED Diagnoses   Final diagnoses:  Flank pain  Kidney stone  Dehydration  Lower urinary tract infectious disease    New Prescriptions Discharge Medication List as of 11/09/2016 11:40 PM    START taking these medications   Details  ciprofloxacin (CIPRO) 500 MG tablet Take 1 tablet (500 mg total) by mouth 2 (two) times daily., Starting Sat 11/09/2016, Print    HYDROcodone-acetaminophen (NORCO/VICODIN) 5-325 MG tablet Take 0.5 tablets by mouth every 8 (eight) hours as needed for moderate pain., Starting Sat 11/09/2016, Print    promethazine (PHENERGAN) 12.5 MG tablet Take 1 tablet (12.5 mg total) by mouth every 8 (eight) hours as needed for nausea or vomiting., Starting Sat 11/09/2016, Print         Saide Lanuza, [place], PA-C 11/11/16 0042    Julianne Rice, MD 11/13/16 [phone]

## 2016-11-12 ENCOUNTER — Emergency Department (HOSPITAL_COMMUNITY)
Admission: EM | Admit: 2016-11-12 | Discharge: 2016-11-12 | Disposition: A | Discharge status: Home / Self Care | Payer: Medicare Other | Attending: Emergency Medicine | Admitting: Emergency Medicine

## 2016-11-12 ENCOUNTER — Encounter (HOSPITAL_COMMUNITY): Payer: Self-pay | Admitting: Emergency Medicine

## 2016-11-12 DIAGNOSIS — N390 Urinary tract infection, site not specified: Secondary | ICD-10-CM | POA: Diagnosis not present

## 2016-11-12 DIAGNOSIS — F17210 Nicotine dependence, cigarettes, uncomplicated: Secondary | ICD-10-CM | POA: Diagnosis not present

## 2016-11-12 DIAGNOSIS — I10 Essential (primary) hypertension: Secondary | ICD-10-CM | POA: Insufficient documentation

## 2016-11-12 DIAGNOSIS — Z79899 Other long term (current) drug therapy: Secondary | ICD-10-CM | POA: Diagnosis not present

## 2016-11-12 DIAGNOSIS — R319 Hematuria, unspecified: Secondary | ICD-10-CM | POA: Diagnosis not present

## 2016-11-12 DIAGNOSIS — R630 Anorexia: Secondary | ICD-10-CM

## 2016-11-12 LAB — URINALYSIS, ROUTINE W REFLEX MICROSCOPIC
Bilirubin Urine: NEGATIVE
GLUCOSE, UA: NEGATIVE mg/dL
Ketones, ur: NEGATIVE mg/dL
NITRITE: NEGATIVE
Protein, ur: 30 mg/dL — AB
SPECIFIC GRAVITY, URINE: 1.019 (ref 1.005–1.030)
pH: 5 (ref 5.0–8.0)

## 2016-11-12 LAB — URINE CULTURE: Culture: >=100000 — AB

## 2016-11-12 LAB — CBC
HEMATOCRIT: 42.1 % (ref 36.0–46.0)
Hemoglobin: 14.5 g/dL (ref 12.0–15.0)
MCH: 31.8 pg (ref 26.0–34.0)
MCHC: 34.4 g/dL (ref 30.0–36.0)
MCV: 92.3 fL (ref 78.0–100.0)
PLATELETS: 331 10*3/uL (ref 150–400)
RBC: 4.56 MIL/uL (ref 3.87–5.11)
RDW: 12.6 % (ref 11.5–15.5)
WBC: 13.2 10*3/uL — ABNORMAL HIGH (ref 4.0–10.5)

## 2016-11-12 LAB — COMPREHENSIVE METABOLIC PANEL
ALBUMIN: 3.4 g/dL — AB (ref 3.5–5.0)
ALK PHOS: 77 U/L (ref 38–126)
ALT: 14 U/L (ref 14–54)
AST: 18 U/L (ref 15–41)
Anion gap: 7 (ref 5–15)
BILIRUBIN TOTAL: 1.2 mg/dL (ref 0.3–1.2)
BUN: 12 mg/dL (ref 6–20)
CALCIUM: 9.3 mg/dL (ref 8.9–10.3)
CO2: 29 mmol/L (ref 22–32)
CREATININE: 0.81 mg/dL (ref 0.44–1.00)
Chloride: 99 mmol/L — ABNORMAL LOW (ref 101–111)
GFR calc Af Amer: >60 mL/min (ref >60)
Glucose, Bld: 130 mg/dL — ABNORMAL HIGH (ref 65–99)
POTASSIUM: 3.6 mmol/L (ref 3.5–5.1)
Sodium: 135 mmol/L (ref 135–145)
TOTAL PROTEIN: 6.4 g/dL — AB (ref 6.5–8.1)

## 2016-11-12 LAB — I-STAT CG4 LACTIC ACID, ED: Lactic Acid, Venous: 1.19 mmol/L (ref 0.5–1.9)

## 2016-11-12 LAB — LIPASE, BLOOD: Lipase: 24 U/L (ref 11–51)

## 2016-11-12 MED ORDER — LIDOCAINE HCL (PF) 1 % IJ SOLN
INTRAMUSCULAR | Status: AC
  Administered 2016-11-12: 2 mL
  Filled 2016-11-12: qty 5

## 2016-11-12 MED ORDER — SODIUM CHLORIDE 0.9 % IV BOLUS (SEPSIS): 500.0000 mL | Freq: Once | INTRAVENOUS | Status: DC

## 2016-11-12 MED ORDER — CIPROFLOXACIN HCL 500 MG PO TABS: 500.0000 mg | ORAL_TABLET | Freq: Two times a day (BID) | ORAL | 0 refills | Status: DC

## 2016-11-12 MED ORDER — CEFTRIAXONE SODIUM 1 G IJ SOLR
1.0000 g | Freq: Once | INTRAMUSCULAR | Status: AC
  Administered 2016-11-12: 1 g via INTRAMUSCULAR
  Filled 2016-11-12: qty 10

## 2016-11-12 MED ORDER — DEXTROSE 5 % IV SOLN: 1.0000 g | Freq: Once | INTRAVENOUS | Status: DC

## 2016-11-12 NOTE — ED Notes (Signed)
Pt states she is currently not nauseous and was able to eat a sandwich from downstairs with no episode of emesis. Pt states she hasn't had any vomiting since yesterday, but feels she wouldn't gotten sick if she had taken her medication. Pt ambulated to RR with no difficulty and was unassisted.

## 2016-11-12 NOTE — Discharge Instructions (Signed)
TAKE YOUR COURSE OF CIPROFLOXACIN AS PRESCRIBED. DRINK PLENTY OF FLUIDS AND TAKE ZOFRAN OR PHENERGAN IF YOU HAVE NAUSEA. RETURN TO ER IF ANY FEVER, BACK/ABDOMINAL PAIN, CONFUSION, OR SIGNIFICANT VOMITING.

## 2016-11-12 NOTE — ED Triage Notes (Signed)
Pt told to come here for IV antibiotics due to unable to hold down PO antibiotics and having sx of kidney infection after passing kidney stone; pt sts generalized weakness

## 2016-11-12 NOTE — ED Notes (Signed)
Pt. States she actually never got her prescription filled and that her husband threw away the prescription. Pt. States she hasn't had vomiting since Thursday. RN educated patient on antibiotics and urged to get prescription filled this time and take full course of  Medication.

## 2016-11-12 NOTE — ED Provider Notes (Signed)
[facility] DEPT Provider Note   CSN: [id number] Arrival date & time: 11/12/16  1014     History   Chief Complaint Chief Complaint  Patient presents with  . Abnormal Lab    HPI Alyssa Howard is a 76 y.o. female.  76 year old female with hypertension and hyperlipidemia who presents with poor appetite and UTI. The patient was evaluated here 2 days ago and diagnosed with UTI and recently passed kidney stone. When her symptoms initially began she had a lot of nausea and vomiting. She has not had any vomiting for 5 days but reports nausea and decreased appetite. She has not taken any of her antibiotics that were prescribed 2 days ago because she has not been able to eat and does not want to take any antibiotics on an empty stomach. She denies any fevers, abdominal pain, urinary symptoms, breathing problems, or other complaints. Currently her nausea has resolved and she ate a half of a sandwich while in the ED.   The history is provided by the patient.  Abnormal Lab    Past Medical History:  Diagnosis Date  . Hypercholesterolemia   . Hypertension   . Osteopenia   . Syncope and collapse     Patient Active Problem List   Diagnosis Date Noted  . HYPERLIPIDEMIA 01/01/2007  . TOBACCO USER 01/01/2007    Past Surgical History:  Procedure Laterality Date  . CATARACT EXTRACTION      OB History    No data available       Home Medications    Prior to Admission medications   Medication Sig Start Date End Date Taking? Authorizing Provider  atenolol (TENORMIN) 25 MG tablet Take 25 mg by mouth daily.    [provider]  calcium carbonate 1250 MG capsule Take 1,250 mg by mouth daily.    [provider]  ciprofloxacin (CIPRO) 500 MG tablet Take 1 tablet (500 mg total) by mouth 2 (two) times daily. 11/12/16   Jazsmin Couse, Wenda Overland, MD  fish oil-omega-3 fatty acids 1000 MG capsule Take 1 g by mouth daily.    [provider]  HYDROcodone-acetaminophen  (NORCO/VICODIN) 5-325 MG tablet Take 0.5 tablets by mouth every 8 (eight) hours as needed for moderate pain. 11/09/16   Lawyer, Harrell Gave, PA-C  ondansetron (ZOFRAN-ODT) 4 MG disintegrating tablet Take 1 tablet (4 mg total) by mouth every 8 (eight) hours as needed for nausea or vomiting. 11/07/16   Davonna Belling, MD  promethazine (PHENERGAN) 12.5 MG tablet Take 1 tablet (12.5 mg total) by mouth every 8 (eight) hours as needed for nausea or vomiting. 11/09/16   Dalia Heading, PA-C    Family History Family History  Problem Relation Age of Onset  . Diabetes Mother   . Stroke Mother   . Melanoma Sister   . Heart disease Brother   . Alzheimer's disease Brother     Social History Social History  Substance Use Topics  . Smoking status: Current Every Day Smoker    Packs/day: 0.50    Types: Cigarettes  . Smokeless tobacco: Never Used  . Alcohol use No     Allergies   Penicillins and Zetia [ezetimibe]   Review of Systems Review of Systems All other systems reviewed and are negative except that which was mentioned in HPI  Physical Exam Updated Vital Signs BP (!) 107/54   Pulse 90   Temp 97.9 F (36.6 C) (Oral)   Resp 15   SpO2 100%   Physical Exam  Constitutional: She  is oriented to person, place, and time. She appears well-developed and well-nourished. No distress.  HENT:  Head: Normocephalic and atraumatic.  Moist mucous membranes  Eyes: Conjunctivae are normal. Pupils are equal, round, and reactive to light.  Neck: Neck supple.  Cardiovascular: Normal rate, regular rhythm and normal heart sounds.   No murmur heard. Pulmonary/Chest: Effort normal and breath sounds normal.  Abdominal: Soft. Bowel sounds are normal. She exhibits no distension. There is no tenderness.  Musculoskeletal: She exhibits no edema.  Neurological: She is alert and oriented to person, place, and time.  Fluent speech  Skin: Skin is warm and dry.  Psychiatric: She has a normal mood and  affect. Judgment normal.  Nursing note and vitals reviewed.    ED Treatments / Results  Labs (all labs ordered are listed, but only abnormal results are displayed) Labs Reviewed  COMPREHENSIVE METABOLIC PANEL - Abnormal; Notable for the following:       Result Value   Chloride 99 (*)    Glucose, Bld 130 (*)    Total Protein 6.4 (*)    Albumin 3.4 (*)    All other components within normal limits  CBC - Abnormal; Notable for the following:    WBC 13.2 (*)    All other components within normal limits  URINALYSIS, ROUTINE W REFLEX MICROSCOPIC - Abnormal; Notable for the following:    Color, Urine AMBER (*)    APPearance CLOUDY (*)    Hgb urine dipstick MODERATE (*)    Protein, ur 30 (*)    Leukocytes, UA MODERATE (*)    Bacteria, UA RARE (*)    Squamous Epithelial / LPF 0-5 (*)    All other components within normal limits  URINE CULTURE  LIPASE, BLOOD  I-STAT CG4 LACTIC ACID, ED  I-STAT CG4 LACTIC ACID, ED    EKG  EKG Interpretation None       Radiology No results found.  Procedures Procedures (including critical care time)  Medications Ordered in ED Medications  cefTRIAXone (ROCEPHIN) injection 1 g (1 g Intramuscular Given 11/12/16 1440)  lidocaine (PF) (XYLOCAINE) 1 % injection (2 mLs  Given 11/12/16 1440)     Initial Impression / Assessment and Plan / ED Course  I have reviewed the triage vital signs and the nursing notes.  Pertinent labs that were available during my care of the patient were reviewed by me and considered in my medical decision making (see chart for details).    PT w/ decreased appetite and concern that she has not taken antibiotics for UTI for the past 2 days. I reviewed her visit from 5/19 which shows CT demonstrating recently passed stone sitting in the bladder at the UVJ. Her urine culture at that time is growing pansensitive Escherichia coli. She has had food and drink here with no difficulties and I explained that her ciprofloxacin  course is completely appropriate based on her culture. Her lab work today shows normal creatinine, persistent UTI but no evidence of dehydration, WBC improving today at 13,000. Her vital signs are normal here and given that she is completely asymptomatic I feel she is totally appropriate for outpatient treatment. I gave her a dose of IM ceftriaxone here. She stated that she threw the prescription away from 2 days ago, so I provided another course of ciprofloxacin. Reviewed return precautions. Patient voiced understanding and was discharged in satisfactory condition.  Final Clinical Impressions(s) / ED Diagnoses   Final diagnoses:  Urinary tract infection with hematuria, site unspecified  Poor  appetite    New Prescriptions New Prescriptions   CIPROFLOXACIN (CIPRO) 500 MG TABLET    Take 1 tablet (500 mg total) by mouth 2 (two) times daily.     Stevey Stapleton, Wenda Overland, MD 11/12/16 [phone]

## 2016-11-13 ENCOUNTER — Telehealth: Payer: Self-pay | Admitting: Emergency Medicine

## 2016-11-13 LAB — URINE CULTURE
CULTURE: NO GROWTH
SPECIAL REQUESTS: NORMAL

## 2016-11-13 NOTE — Telephone Encounter (Signed)
Post ED Visit - Positive Culture Follow-up  Culture report reviewed by antimicrobial stewardship pharmacist:  []  Elenor Quinones, Pharm.D. [x]  Heide Guile, Pharm.D., BCPS AQ-ID []  Parks Neptune, Pharm.D., BCPS []  Alycia Rossetti, Pharm.D., BCPS []  [place], [place].D., BCPS, AAHIVP []  Legrand Como, Pharm.D., BCPS, AAHIVP []  Salome Arnt, PharmD, BCPS []  Dimitri Ped, PharmD, BCPS []  Vincenza Hews, PharmD, BCPS  Positive urine culture Treated with ciprolfloxacin, organism sensitive to the same and no further patient follow-up is required at this time.  Hazle Nordmann 11/13/2016, 11:08 AM

## 2016-11-25 DIAGNOSIS — R000 Tachycardia, unspecified: Secondary | ICD-10-CM | POA: Diagnosis not present

## 2016-11-25 DIAGNOSIS — E559 Vitamin D deficiency, unspecified: Secondary | ICD-10-CM | POA: Diagnosis not present

## 2016-11-25 DIAGNOSIS — J439 Emphysema, unspecified: Secondary | ICD-10-CM | POA: Diagnosis not present

## 2016-11-25 DIAGNOSIS — F17200 Nicotine dependence, unspecified, uncomplicated: Secondary | ICD-10-CM | POA: Diagnosis not present

## 2016-11-25 DIAGNOSIS — Z681 Body mass index (BMI) 19 or less, adult: Secondary | ICD-10-CM | POA: Diagnosis not present

## 2016-11-25 DIAGNOSIS — N200 Calculus of kidney: Secondary | ICD-10-CM | POA: Diagnosis not present

## 2016-11-25 DIAGNOSIS — Z1389 Encounter for screening for other disorder: Secondary | ICD-10-CM | POA: Diagnosis not present

## 2016-11-25 DIAGNOSIS — R634 Abnormal weight loss: Secondary | ICD-10-CM | POA: Diagnosis not present

## 2016-12-27 DIAGNOSIS — J439 Emphysema, unspecified: Secondary | ICD-10-CM | POA: Diagnosis not present

## 2016-12-27 DIAGNOSIS — R011 Cardiac murmur, unspecified: Secondary | ICD-10-CM | POA: Diagnosis not present

## 2016-12-27 DIAGNOSIS — R000 Tachycardia, unspecified: Secondary | ICD-10-CM | POA: Diagnosis not present

## 2016-12-27 DIAGNOSIS — Z681 Body mass index (BMI) 19 or less, adult: Secondary | ICD-10-CM | POA: Diagnosis not present

## 2017-01-21 DIAGNOSIS — H52223 Regular astigmatism, bilateral: Secondary | ICD-10-CM | POA: Diagnosis not present

## 2017-01-21 DIAGNOSIS — H01005 Unspecified blepharitis left lower eyelid: Secondary | ICD-10-CM | POA: Diagnosis not present

## 2017-01-21 DIAGNOSIS — H01004 Unspecified blepharitis left upper eyelid: Secondary | ICD-10-CM | POA: Diagnosis not present

## 2017-01-21 DIAGNOSIS — H524 Presbyopia: Secondary | ICD-10-CM | POA: Diagnosis not present

## 2017-01-21 DIAGNOSIS — H04203 Unspecified epiphora, bilateral lacrimal glands: Secondary | ICD-10-CM | POA: Diagnosis not present

## 2017-01-21 DIAGNOSIS — D2312 Other benign neoplasm of skin of left eyelid, including canthus: Secondary | ICD-10-CM | POA: Diagnosis not present

## 2017-02-13 DIAGNOSIS — L570 Actinic keratosis: Secondary | ICD-10-CM | POA: Diagnosis not present

## 2017-02-13 DIAGNOSIS — Z85828 Personal history of other malignant neoplasm of skin: Secondary | ICD-10-CM | POA: Diagnosis not present

## 2017-02-13 DIAGNOSIS — D1801 Hemangioma of skin and subcutaneous tissue: Secondary | ICD-10-CM | POA: Diagnosis not present

## 2017-02-13 DIAGNOSIS — D692 Other nonthrombocytopenic purpura: Secondary | ICD-10-CM | POA: Diagnosis not present

## 2017-02-13 DIAGNOSIS — L720 Epidermal cyst: Secondary | ICD-10-CM | POA: Diagnosis not present

## 2017-02-13 DIAGNOSIS — B078 Other viral warts: Secondary | ICD-10-CM | POA: Diagnosis not present

## 2017-02-13 DIAGNOSIS — L821 Other seborrheic keratosis: Secondary | ICD-10-CM | POA: Diagnosis not present

## 2017-02-14 ENCOUNTER — Emergency Department (HOSPITAL_COMMUNITY)
Admission: EM | Admit: 2017-02-14 | Discharge: 2017-02-15 | Disposition: A | Discharge status: Home / Self Care | Payer: Medicare Other | Attending: Emergency Medicine | Admitting: Emergency Medicine

## 2017-02-14 ENCOUNTER — Emergency Department (HOSPITAL_COMMUNITY): Payer: Medicare Other

## 2017-02-14 ENCOUNTER — Encounter (HOSPITAL_COMMUNITY): Payer: Self-pay | Admitting: Emergency Medicine

## 2017-02-14 DIAGNOSIS — I10 Essential (primary) hypertension: Secondary | ICD-10-CM | POA: Diagnosis not present

## 2017-02-14 DIAGNOSIS — R1031 Right lower quadrant pain: Secondary | ICD-10-CM | POA: Diagnosis present

## 2017-02-14 DIAGNOSIS — R3129 Other microscopic hematuria: Secondary | ICD-10-CM | POA: Diagnosis not present

## 2017-02-14 DIAGNOSIS — Z681 Body mass index (BMI) 19 or less, adult: Secondary | ICD-10-CM | POA: Diagnosis not present

## 2017-02-14 DIAGNOSIS — N201 Calculus of ureter: Secondary | ICD-10-CM | POA: Diagnosis not present

## 2017-02-14 DIAGNOSIS — R300 Dysuria: Secondary | ICD-10-CM | POA: Diagnosis not present

## 2017-02-14 DIAGNOSIS — N132 Hydronephrosis with renal and ureteral calculous obstruction: Secondary | ICD-10-CM | POA: Diagnosis not present

## 2017-02-14 DIAGNOSIS — F17210 Nicotine dependence, cigarettes, uncomplicated: Secondary | ICD-10-CM | POA: Insufficient documentation

## 2017-02-14 DIAGNOSIS — N200 Calculus of kidney: Secondary | ICD-10-CM | POA: Diagnosis not present

## 2017-02-14 DIAGNOSIS — R112 Nausea with vomiting, unspecified: Secondary | ICD-10-CM | POA: Diagnosis not present

## 2017-02-14 LAB — COMPREHENSIVE METABOLIC PANEL
ALT: 14 U/L (ref 14–54)
ANION GAP: 10 (ref 5–15)
AST: 21 U/L (ref 15–41)
Albumin: 4.1 g/dL (ref 3.5–5.0)
Alkaline Phosphatase: 87 U/L (ref 38–126)
BUN: 18 mg/dL (ref 6–20)
CHLORIDE: 103 mmol/L (ref 101–111)
CO2: 24 mmol/L (ref 22–32)
CREATININE: 0.93 mg/dL (ref 0.44–1.00)
Calcium: 9.7 mg/dL (ref 8.9–10.3)
GFR, EST NON AFRICAN AMERICAN: 58 mL/min — AB (ref >60)
Glucose, Bld: 124 mg/dL — ABNORMAL HIGH (ref 65–99)
Potassium: 4.1 mmol/L (ref 3.5–5.1)
Sodium: 137 mmol/L (ref 135–145)
Total Bilirubin: 0.8 mg/dL (ref 0.3–1.2)
Total Protein: 7.3 g/dL (ref 6.5–8.1)

## 2017-02-14 LAB — URINALYSIS, ROUTINE W REFLEX MICROSCOPIC
Bilirubin Urine: NEGATIVE
Glucose, UA: NEGATIVE mg/dL
KETONES UR: NEGATIVE mg/dL
LEUKOCYTES UA: NEGATIVE
Nitrite: NEGATIVE
PH: 5.5 (ref 5.0–8.0)
PROTEIN: NEGATIVE mg/dL
Specific Gravity, Urine: 1.03 (ref 1.005–1.030)

## 2017-02-14 LAB — LIPASE, BLOOD: LIPASE: 30 U/L (ref 11–51)

## 2017-02-14 LAB — CBC
HEMATOCRIT: 46.6 % — AB (ref 36.0–46.0)
HEMOGLOBIN: 15.8 g/dL — AB (ref 12.0–15.0)
MCH: 31.9 pg (ref 26.0–34.0)
MCHC: 33.9 g/dL (ref 30.0–36.0)
MCV: 94 fL (ref 78.0–100.0)
PLATELETS: 322 10*3/uL (ref 150–400)
RBC: 4.96 MIL/uL (ref 3.87–5.11)
RDW: 13.1 % (ref 11.5–15.5)
WBC: 18.1 10*3/uL — AB (ref 4.0–10.5)

## 2017-02-14 MED ORDER — ONDANSETRON 4 MG PO TBDP
4.0000 mg | ORAL_TABLET | Freq: Once | ORAL | Status: AC | PRN
  Administered 2017-02-14: 4 mg via ORAL
  Filled 2017-02-14: qty 1

## 2017-02-14 NOTE — ED Provider Notes (Signed)
[facility] DEPT Provider Note   CSN: [id number] Arrival date & time: 02/14/17  1702     History   Chief Complaint Chief Complaint  Patient presents with  . Flank Pain    HPI Alyssa Howard is a 76 y.o. female.  Patient presents to the emergency department with chief complaint of flank pain. She reports a history of kidney stones. She states that she has had hematuria and dysuria today as well as severe right flank pain which is causing her to vomit. She states that she was seen by her primary care doctor today, and was sent to the emergency department for evaluation of kidney stone. She was treated with a pain shot there and has had good relief of her pain symptoms. She states that her pain is currently gone.  Denies any fevers or chills.   The history is provided by the patient. No language interpreter was used.    Past Medical History:  Diagnosis Date  . Hypercholesterolemia   . Hypertension   . Osteopenia   . Syncope and collapse     Patient Active Problem List   Diagnosis Date Noted  . HYPERLIPIDEMIA 01/01/2007  . TOBACCO USER 01/01/2007    Past Surgical History:  Procedure Laterality Date  . CATARACT EXTRACTION      OB History    No data available       Home Medications    Prior to Admission medications   Medication Sig Start Date End Date Taking? Authorizing Provider  cholecalciferol (VITAMIN D) 1000 units tablet Take 1,000 Units by mouth daily.   Yes [provider]  ciprofloxacin (CIPRO) 500 MG tablet Take 1 tablet (500 mg total) by mouth 2 (two) times daily. Patient not taking: Reported on 02/14/2017 11/12/16   Little, Wenda Overland, MD  HYDROcodone-acetaminophen (NORCO/VICODIN) 5-325 MG tablet Take 0.5 tablets by mouth every 8 (eight) hours as needed for moderate pain. Patient not taking: Reported on 02/14/2017 11/09/16   Dalia Heading, PA-C  ondansetron (ZOFRAN-ODT) 4 MG disintegrating tablet Take 1 tablet (4 mg total) by mouth  every 8 (eight) hours as needed for nausea or vomiting. Patient not taking: Reported on 02/14/2017 11/07/16   Davonna Belling, MD  promethazine (PHENERGAN) 12.5 MG tablet Take 1 tablet (12.5 mg total) by mouth every 8 (eight) hours as needed for nausea or vomiting. Patient not taking: Reported on 02/14/2017 11/09/16   Dalia Heading, PA-C    Family History Family History  Problem Relation Age of Onset  . Diabetes Mother   . Stroke Mother   . Melanoma Sister   . Heart disease Brother   . Alzheimer's disease Brother     Social History Social History  Substance Use Topics  . Smoking status: Current Every Day Smoker    Packs/day: 0.50    Types: Cigarettes  . Smokeless tobacco: Never Used  . Alcohol use No     Allergies   Penicillins and Zetia [ezetimibe]   Review of Systems Review of Systems  All other systems reviewed and are negative.    Physical Exam Updated Vital Signs BP (!) 150/113 (BP Location: Left Arm)   Pulse 88   Temp 98.3 F (36.8 C) (Oral)   SpO2 100%   Physical Exam  Constitutional: She is oriented to person, place, and time. She appears well-developed and well-nourished.  HENT:  Head: Normocephalic and atraumatic.  Eyes: Pupils are equal, round, and reactive to light. Conjunctivae and EOM are normal.  Neck: Normal range of motion.  Neck supple.  Cardiovascular: Normal rate and regular rhythm.  Exam reveals no gallop and no friction rub.   No murmur heard. Pulmonary/Chest: Effort normal and breath sounds normal. No respiratory distress. She has no wheezes. She has no rales. She exhibits no tenderness.  Abdominal: Soft. Bowel sounds are normal. She exhibits no distension and no mass. There is no tenderness. There is no rebound and no guarding.  Musculoskeletal: Normal range of motion. She exhibits no edema or tenderness.  Neurological: She is alert and oriented to person, place, and time.  Skin: Skin is warm and dry.  Psychiatric: She has a normal  mood and affect. Her behavior is normal. Judgment and thought content normal.  Nursing note and vitals reviewed.    ED Treatments / Results  Labs (all labs ordered are listed, but only abnormal results are displayed) Labs Reviewed  COMPREHENSIVE METABOLIC PANEL - Abnormal; Notable for the following:       Result Value   Glucose, Bld 124 (*)    GFR calc non Af Amer 58 (*)    All other components within normal limits  CBC - Abnormal; Notable for the following:    WBC 18.1 (*)    Hemoglobin 15.8 (*)    HCT 46.6 (*)    All other components within normal limits  URINALYSIS, ROUTINE W REFLEX MICROSCOPIC - Abnormal; Notable for the following:    Hgb urine dipstick LARGE (*)    Bacteria, UA RARE (*)    Squamous Epithelial / LPF 0-5 (*)    All other components within normal limits  LIPASE, BLOOD    EKG  EKG Interpretation None       Radiology No results found.  Procedures Procedures (including critical care time)  Medications Ordered in ED Medications  ondansetron (ZOFRAN-ODT) disintegrating tablet 4 mg (4 mg Oral Given 02/14/17 1741)     Initial Impression / Assessment and Plan / ED Course  I have reviewed the triage vital signs and the nursing notes.  Pertinent labs & imaging results that were available during my care of the patient were reviewed by me and considered in my medical decision making (see chart for details).     Patient with right flank.  Will check CT renal study.  CT shows 36mm stone as above.  Pain is 0.  Patient offered prescription pain meds, but refuses.  No fever.  VSS.  Doubt infected stone, but will send urine for culture.  Patient states that she would prefer not to take antibiotics if possible.  I think it is reasonable to wait for culture.  Of note, she has done well on Cipro in the past.  Patient seen by and discussed with Dr. Jeneen Rinks, who agrees with the plan.  Final Clinical Impressions(s) / ED Diagnoses   Final diagnoses:  Kidney stone      New Prescriptions New Prescriptions   No medications on file     Delaine Lame 02/15/17 Fayne Mediate, MD 02/25/17 [phone]

## 2017-02-14 NOTE — ED Triage Notes (Addendum)
Patient reports right flank pain that started around 2 pm today and then started having n/v. Patient was seen at [facility] and given Phenergan 12.5mg  IM at 1635.  Patient sent over for possible kidney stone.  Patient passed kidney stone back In May of this year,  Patient states that she had trouble and pain with urination starting this afternoon as well. Patient had UA done at PCP office and was positive for blood but no signs of infection.

## 2017-02-14 NOTE — ED Provider Notes (Signed)
Patient seen and evaluated. Also nausea right flank pain this afternoon. One prior ureteral stone that passed spontaneously. Is pain-free after pain medicine here. Has hematuria. CT scan shows high gel and apparent intraluminal bladder stone. Await radiology read. Patient resting comfortable.   Tanna Furry, MD 02/14/17 [phone]

## 2017-02-15 DIAGNOSIS — N200 Calculus of kidney: Secondary | ICD-10-CM | POA: Diagnosis not present

## 2017-02-17 DIAGNOSIS — N200 Calculus of kidney: Secondary | ICD-10-CM | POA: Diagnosis not present

## 2017-02-17 DIAGNOSIS — Z681 Body mass index (BMI) 19 or less, adult: Secondary | ICD-10-CM | POA: Diagnosis not present

## 2017-02-17 DIAGNOSIS — R300 Dysuria: Secondary | ICD-10-CM | POA: Diagnosis not present

## 2017-02-17 DIAGNOSIS — R3129 Other microscopic hematuria: Secondary | ICD-10-CM | POA: Diagnosis not present

## 2017-02-17 DIAGNOSIS — R112 Nausea with vomiting, unspecified: Secondary | ICD-10-CM | POA: Diagnosis not present

## 2017-02-18 LAB — URINE CULTURE

## 2017-07-17 DIAGNOSIS — M810 Age-related osteoporosis without current pathological fracture: Secondary | ICD-10-CM | POA: Diagnosis not present

## 2017-07-17 DIAGNOSIS — R82998 Other abnormal findings in urine: Secondary | ICD-10-CM | POA: Diagnosis not present

## 2017-07-17 DIAGNOSIS — E782 Mixed hyperlipidemia: Secondary | ICD-10-CM | POA: Diagnosis not present

## 2017-07-24 DIAGNOSIS — Z1389 Encounter for screening for other disorder: Secondary | ICD-10-CM | POA: Diagnosis not present

## 2017-07-24 DIAGNOSIS — M810 Age-related osteoporosis without current pathological fracture: Secondary | ICD-10-CM | POA: Diagnosis not present

## 2017-07-24 DIAGNOSIS — F17200 Nicotine dependence, unspecified, uncomplicated: Secondary | ICD-10-CM | POA: Diagnosis not present

## 2017-07-24 DIAGNOSIS — I10 Essential (primary) hypertension: Secondary | ICD-10-CM | POA: Diagnosis not present

## 2017-07-24 DIAGNOSIS — E782 Mixed hyperlipidemia: Secondary | ICD-10-CM | POA: Diagnosis not present

## 2017-07-24 DIAGNOSIS — J439 Emphysema, unspecified: Secondary | ICD-10-CM | POA: Diagnosis not present

## 2017-07-24 DIAGNOSIS — Z0000 Encounter for general adult medical examination without abnormal findings: Secondary | ICD-10-CM | POA: Diagnosis not present

## 2017-07-24 DIAGNOSIS — Z681 Body mass index (BMI) 19 or less, adult: Secondary | ICD-10-CM | POA: Diagnosis not present

## 2017-07-24 DIAGNOSIS — N390 Urinary tract infection, site not specified: Secondary | ICD-10-CM | POA: Diagnosis not present

## 2017-07-24 DIAGNOSIS — R011 Cardiac murmur, unspecified: Secondary | ICD-10-CM | POA: Diagnosis not present

## 2017-07-24 DIAGNOSIS — H04129 Dry eye syndrome of unspecified lacrimal gland: Secondary | ICD-10-CM | POA: Diagnosis not present

## 2018-01-28 DIAGNOSIS — Z961 Presence of intraocular lens: Secondary | ICD-10-CM | POA: Diagnosis not present

## 2018-01-28 DIAGNOSIS — D23121 Other benign neoplasm of skin of left upper eyelid, including canthus: Secondary | ICD-10-CM | POA: Diagnosis not present

## 2018-01-28 DIAGNOSIS — H353132 Nonexudative age-related macular degeneration, bilateral, intermediate dry stage: Secondary | ICD-10-CM | POA: Diagnosis not present

## 2018-01-28 DIAGNOSIS — H11823 Conjunctivochalasis, bilateral: Secondary | ICD-10-CM | POA: Diagnosis not present

## 2018-01-28 DIAGNOSIS — H52223 Regular astigmatism, bilateral: Secondary | ICD-10-CM | POA: Diagnosis not present

## 2018-01-28 DIAGNOSIS — H04203 Unspecified epiphora, bilateral lacrimal glands: Secondary | ICD-10-CM | POA: Diagnosis not present

## 2018-01-28 DIAGNOSIS — H524 Presbyopia: Secondary | ICD-10-CM | POA: Diagnosis not present

## 2018-03-19 DIAGNOSIS — Z23 Encounter for immunization: Secondary | ICD-10-CM | POA: Diagnosis not present

## 2018-05-07 DIAGNOSIS — L821 Other seborrheic keratosis: Secondary | ICD-10-CM | POA: Diagnosis not present

## 2018-05-07 DIAGNOSIS — D2262 Melanocytic nevi of left upper limb, including shoulder: Secondary | ICD-10-CM | POA: Diagnosis not present

## 2018-05-07 DIAGNOSIS — L570 Actinic keratosis: Secondary | ICD-10-CM | POA: Diagnosis not present

## 2018-05-07 DIAGNOSIS — L720 Epidermal cyst: Secondary | ICD-10-CM | POA: Diagnosis not present

## 2018-05-07 DIAGNOSIS — Z85828 Personal history of other malignant neoplasm of skin: Secondary | ICD-10-CM | POA: Diagnosis not present

## 2018-05-07 DIAGNOSIS — D1801 Hemangioma of skin and subcutaneous tissue: Secondary | ICD-10-CM | POA: Diagnosis not present

## 2018-07-06 DIAGNOSIS — L720 Epidermal cyst: Secondary | ICD-10-CM | POA: Diagnosis not present

## 2018-07-06 DIAGNOSIS — Z85828 Personal history of other malignant neoplasm of skin: Secondary | ICD-10-CM | POA: Diagnosis not present

## 2018-08-04 DIAGNOSIS — H11823 Conjunctivochalasis, bilateral: Secondary | ICD-10-CM | POA: Diagnosis not present

## 2018-08-04 DIAGNOSIS — Z961 Presence of intraocular lens: Secondary | ICD-10-CM | POA: Diagnosis not present

## 2018-08-04 DIAGNOSIS — H26493 Other secondary cataract, bilateral: Secondary | ICD-10-CM | POA: Diagnosis not present

## 2018-08-04 DIAGNOSIS — H04203 Unspecified epiphora, bilateral lacrimal glands: Secondary | ICD-10-CM | POA: Diagnosis not present

## 2018-08-04 DIAGNOSIS — H353132 Nonexudative age-related macular degeneration, bilateral, intermediate dry stage: Secondary | ICD-10-CM | POA: Diagnosis not present

## 2018-08-17 DIAGNOSIS — E559 Vitamin D deficiency, unspecified: Secondary | ICD-10-CM | POA: Diagnosis not present

## 2018-08-17 DIAGNOSIS — I10 Essential (primary) hypertension: Secondary | ICD-10-CM | POA: Diagnosis not present

## 2018-08-17 DIAGNOSIS — E782 Mixed hyperlipidemia: Secondary | ICD-10-CM | POA: Diagnosis not present

## 2018-08-24 DIAGNOSIS — J439 Emphysema, unspecified: Secondary | ICD-10-CM | POA: Diagnosis not present

## 2018-08-24 DIAGNOSIS — F17200 Nicotine dependence, unspecified, uncomplicated: Secondary | ICD-10-CM | POA: Diagnosis not present

## 2018-08-24 DIAGNOSIS — Z1331 Encounter for screening for depression: Secondary | ICD-10-CM | POA: Diagnosis not present

## 2018-08-24 DIAGNOSIS — Z0000 Encounter for general adult medical examination without abnormal findings: Secondary | ICD-10-CM | POA: Diagnosis not present

## 2018-08-24 DIAGNOSIS — E782 Mixed hyperlipidemia: Secondary | ICD-10-CM | POA: Diagnosis not present

## 2018-08-24 DIAGNOSIS — R011 Cardiac murmur, unspecified: Secondary | ICD-10-CM | POA: Diagnosis not present

## 2018-08-24 DIAGNOSIS — M810 Age-related osteoporosis without current pathological fracture: Secondary | ICD-10-CM | POA: Diagnosis not present

## 2018-08-24 DIAGNOSIS — R2681 Unsteadiness on feet: Secondary | ICD-10-CM | POA: Diagnosis not present

## 2018-08-24 DIAGNOSIS — I10 Essential (primary) hypertension: Secondary | ICD-10-CM | POA: Diagnosis not present

## 2018-08-24 DIAGNOSIS — Z681 Body mass index (BMI) 19 or less, adult: Secondary | ICD-10-CM | POA: Diagnosis not present

## 2019-08-26 DIAGNOSIS — E782 Mixed hyperlipidemia: Secondary | ICD-10-CM | POA: Diagnosis not present

## 2019-08-26 DIAGNOSIS — M859 Disorder of bone density and structure, unspecified: Secondary | ICD-10-CM | POA: Diagnosis not present

## 2019-08-27 DIAGNOSIS — R82998 Other abnormal findings in urine: Secondary | ICD-10-CM | POA: Diagnosis not present

## 2019-08-27 DIAGNOSIS — I10 Essential (primary) hypertension: Secondary | ICD-10-CM | POA: Diagnosis not present

## 2019-09-02 DIAGNOSIS — E559 Vitamin D deficiency, unspecified: Secondary | ICD-10-CM | POA: Diagnosis not present

## 2019-09-02 DIAGNOSIS — Z0000 Encounter for general adult medical examination without abnormal findings: Secondary | ICD-10-CM | POA: Diagnosis not present

## 2019-09-02 DIAGNOSIS — H04129 Dry eye syndrome of unspecified lacrimal gland: Secondary | ICD-10-CM | POA: Diagnosis not present

## 2019-09-02 DIAGNOSIS — D751 Secondary polycythemia: Secondary | ICD-10-CM | POA: Diagnosis not present

## 2019-09-02 DIAGNOSIS — R011 Cardiac murmur, unspecified: Secondary | ICD-10-CM | POA: Diagnosis not present

## 2019-09-02 DIAGNOSIS — F17200 Nicotine dependence, unspecified, uncomplicated: Secondary | ICD-10-CM | POA: Diagnosis not present

## 2019-09-02 DIAGNOSIS — Z1339 Encounter for screening examination for other mental health and behavioral disorders: Secondary | ICD-10-CM | POA: Diagnosis not present

## 2019-09-02 DIAGNOSIS — J439 Emphysema, unspecified: Secondary | ICD-10-CM | POA: Diagnosis not present

## 2019-09-02 DIAGNOSIS — R2681 Unsteadiness on feet: Secondary | ICD-10-CM | POA: Diagnosis not present

## 2019-09-02 DIAGNOSIS — I10 Essential (primary) hypertension: Secondary | ICD-10-CM | POA: Diagnosis not present

## 2019-09-02 DIAGNOSIS — E782 Mixed hyperlipidemia: Secondary | ICD-10-CM | POA: Diagnosis not present

## 2019-09-02 DIAGNOSIS — M810 Age-related osteoporosis without current pathological fracture: Secondary | ICD-10-CM | POA: Diagnosis not present

## 2019-09-02 DIAGNOSIS — Z1331 Encounter for screening for depression: Secondary | ICD-10-CM | POA: Diagnosis not present

## 2019-09-15 DIAGNOSIS — J439 Emphysema, unspecified: Secondary | ICD-10-CM | POA: Diagnosis not present

## 2019-09-16 DIAGNOSIS — J439 Emphysema, unspecified: Secondary | ICD-10-CM | POA: Diagnosis not present

## 2019-10-20 ENCOUNTER — Encounter: Payer: Self-pay | Admitting: Neurology

## 2019-10-20 ENCOUNTER — Ambulatory Visit (INDEPENDENT_AMBULATORY_CARE_PROVIDER_SITE_OTHER): Payer: Medicare Other | Admitting: Neurology

## 2019-10-20 ENCOUNTER — Other Ambulatory Visit: Payer: Self-pay

## 2019-10-20 VITALS — BP 122/86 | HR 103 | Temp 98.3°F | Ht 66.0 in | Wt 114.0 lb

## 2019-10-20 DIAGNOSIS — R062 Wheezing: Secondary | ICD-10-CM | POA: Diagnosis not present

## 2019-10-20 DIAGNOSIS — G4734 Idiopathic sleep related nonobstructive alveolar hypoventilation: Secondary | ICD-10-CM | POA: Diagnosis not present

## 2019-10-20 DIAGNOSIS — F17200 Nicotine dependence, unspecified, uncomplicated: Secondary | ICD-10-CM

## 2019-10-20 NOTE — Progress Notes (Signed)
Subjective:    Patient ID: Alyssa Howard is a 79 y.o. female.  HPI     Star Age, MD, PhD [facility] [address] [address] [place], [place] [address]  Dear Dr. Philip Aspen,   I saw your patient, Alyssa Howard, upon your kind request, in my sleep clinic today for initial consultation of her sleep disorder, in particular, concern for underlying obstructive sleep apnea.  The patient is accompanied by her husband today.  As you know, Alyssa Howard is a 79 year old right-handed woman with an underlying medical history of neck pain, smoking, polycythemia, emphysema/COPD (by chart review), dry eyes, macular degeneration, osteopenia, hyperlipidemia with statin intolerance, vitamin D deficiency, and syncope, who reports No significant snoring or daytime somnolence.  Her husband reports that the reason for being here is oxygen drops while asleep.  She reports that her main problem at night is excessive tearing from her eyes which causes her to feel short of breath.  She feels that she sleeps well.  He also agrees that she tends to sleep very well.  Their son was recently checked for sleep apnea as I understand with a sleep study but his reason was different, and they are not sure that he has sleep apnea. She feels anxious at times.  She reports smoking, smokes about half a pack per day and is too scared to stop she says. I reviewed your telemedicine note from 09/02/2019.  She had a recent overnight pulse oximetry test through your office on 09/14/2019 and I reviewed the results, total testing time was 4 hours and 38 minutes, average oxygen saturation 93.3%, nadir was 87%, time below are at 88% saturation was 1.5 minutes.  Her Epworth sleepiness score is 8/24, fatigue severity score is 61 out of 63.  She requested a wheelchair and reports that she fatigues easily. Her hemoglobin was 17.9 on 08/26/2019.  Her Past Medical History Is Significant For: Past Medical History:   Diagnosis Date  . Dry eyes   . Emphysema lung ([place])   . Hypercholesterolemia   . Hypertension   . Neck pain   . Osteopenia   . Osteopenia   . Syncope and collapse   . Vitamin D deficiency     Her Past Surgical History Is Significant For: Past Surgical History:  Procedure Laterality Date  . CATARACT EXTRACTION    . CATARACT EXTRACTION    . SQUAMOUS CELL CARCINOMA EXCISION     Lip    Her Family History Is Significant For: Family History  Problem Relation Age of Onset  . Diabetes Mother   . Stroke Mother   . Melanoma Sister   . Heart disease Brother   . Alzheimer's disease Brother     Her Social History Is Significant For: Social History   Socioeconomic History  . Marital status: Married    Spouse name: Not on file  . Number of children: Not on file  . Years of education: Not on file  . Highest education level: Not on file  Occupational History  . Not on file  Tobacco Use  . Smoking status: Current Every Day Smoker    Packs/day: 0.50    Types: Cigarettes  . Smokeless tobacco: Never Used  Substance and Sexual Activity  . Alcohol use: No  . Drug use: No  . Sexual activity: Yes  Other Topics Concern  . Not on file  Social History Narrative  . Not on file   Social Determinants of Health  Financial Resource Strain:   . Difficulty of Paying Living Expenses:   Food Insecurity:   . Worried About Charity fundraiser in the Last Year:   . Arboriculturist in the Last Year:   Transportation Needs:   . Film/video editor (Medical):   Marland Kitchen Lack of Transportation (Non-Medical):   Physical Activity:   . Days of Exercise per Week:   . Minutes of Exercise per Session:   Stress:   . Feeling of Stress :   Social Connections:   . Frequency of Communication with Friends and Family:   . Frequency of Social Gatherings with Friends and Family:   . Attends Religious Services:   . Active Member of Clubs or Organizations:   . Attends Archivist Meetings:    Marland Kitchen Marital Status:     Her Allergies Are:  Allergies  Allergen Reactions  . Penicillins   . Zetia [Ezetimibe]   :   Her Current Medications Are:  Outpatient Encounter Medications as of 10/20/2019  Medication Sig  . cholecalciferol (VITAMIN D) 1000 units tablet Take 1,000 Units by mouth daily.  . [DISCONTINUED] ciprofloxacin (CIPRO) 500 MG tablet Take 1 tablet (500 mg total) by mouth 2 (two) times daily.  . [DISCONTINUED] HYDROcodone-acetaminophen (NORCO/VICODIN) 5-325 MG tablet Take 0.5 tablets by mouth every 8 (eight) hours as needed for moderate pain.  . [DISCONTINUED] ondansetron (ZOFRAN-ODT) 4 MG disintegrating tablet Take 1 tablet (4 mg total) by mouth every 8 (eight) hours as needed for nausea or vomiting.  . [DISCONTINUED] promethazine (PHENERGAN) 12.5 MG tablet Take 1 tablet (12.5 mg total) by mouth every 8 (eight) hours as needed for nausea or vomiting.   No facility-administered encounter medications on file as of 10/20/2019.  :  Review of Systems:  Out of a complete 14 point review of systems, all are reviewed and negative with the exception of these symptoms as listed below: Review of Systems  Neurological:       Here for sleep consult. No prior sleep study. Pt complete ONO on 09/14/2019 here for consultation.  Epworth Sleepiness Scale 0= would never doze 1= slight chance of dozing 2= moderate chance of dozing 3= high chance of dozing  Sitting and reading:0 Watching TV:2 Sitting inactive in a public place (ex. Theater or meeting):0 As a passenger in a car for an hour without a break:0 Lying down to rest in the afternoon:3 Sitting and talking to someone:0 Sitting quietly after lunch (no alcohol):3 In a car, while stopped in traffic:0 Total:8     Objective:  Neurological Exam  Physical Exam Physical Examination:   Vitals:   10/20/19 1331  BP: 122/86  Pulse: (!) 103  Temp: 98.3 F (36.8 C)   General Examination: The patient is a very pleasant 79 y.o.  female in no acute distress. She appears thin and frail, mildly anxious. In clinic wheelchair.  HEENT: Normocephalic, atraumatic, pupils are equal, round and reactive to light, extraocular tracking is good without limitation to gaze excursion or nystagmus noted.  Cataract extractions, hearing is significantly impaired.  Face is symmetric, speech is clear, no hypophonia or voice tremor, airway examination reveals moderate to severe mouth dryness, redundant soft palate, smaller airway entry, tonsils small, neck circumference of 13 inches.  Tongue protrudes centrally in palate elevates symmetrically, no carotid bruits.    Chest: wheezing noted bilaterally, left more than right, denies any shortness of breath.    Heart: S1+S2+0, mild tachycardia, no murmur noted.    Abdomen:  Soft, non-tender and non-distended with normal bowel sounds appreciated on auscultation.  Extremities: There is no pitting edema in the distal lower extremities bilaterally.   Skin: Warm and dry without trophic changes noted.   Musculoskeletal: exam reveals no obvious joint deformities, tenderness or joint swelling or erythema.   Neurologically:  Mental status: The patient is awake, alert and oriented in all 4 spheres. Her immediate and remote memory, attention, language skills and fund of knowledge are appropriate. There is no evidence of aphasia, agnosia, apraxia or anomia. Speech is clear with normal prosody and enunciation. Thought process is linear. Mood is normal and affect is normal.  Cranial nerves II - XII are as described above under HEENT exam.  Motor exam: Thin bulk, global strength of 4+ out of 5, no tremor, fine motor skills are age-appropriate, no obvious dysmetria or intention tremor, sensory exam is intact to light touch.    Assessment and Plan:  In summary, JENEVY AHERNE is a very pleasant 79 y.o.-year old female with an underlying medical history of neck pain, smoking, polycythemia, emphysema/COPD (by  chart review), dry eyes, macular degeneration, osteopenia, hyperlipidemia with statin intolerance, vitamin D deficiency, and syncope, who presents for evaluation of her sleep disorder.  She had a recent overnight pulse oximetry test with mild desaturations noted.  She denies any significant sleep related disturbance but does report excessive drainage from her eyes at night which makes her feel short of breath at times.  I noted wheezing on examination today.  She reports no underlying lung disease but by chart review she has COPD or emphysema.  She continues to smoke about half a pack per day and is encouraged to quit smoking and to talk to about potentially seeing a lung specialist.  She is not on any inhalers.  She is advised that underlying obstructive sleep apnea can result in oxygen desaturations during sleep.  I explained the sleep study procedure to her.  She declines a sleep study at this time.  We talked about underlying obstructive sleep apnea and the possibility that she does have underlying lung disease such as COPD.  They are encouraged to think about the sleep study.  If she changes her mind I would be happy to order an overnight polysomnogram or a sleep study.  We talked about treatment options for obstructive sleep apnea in particular CPAP or AutoPap therapy.  She is advised to talk to you about potentially seeing a lung specialist since she does report occasional nighttime shortness of breath and she has wheezing on examination today.  I answered all the questions today and would be happy to see her back as needed.   Thank you very much for allowing me to participate in the care of this nice patient. If I can be of any further assistance to you please do not hesitate to call me at [phone].  Sincerely,   Star Age, MD, PhD

## 2019-10-20 NOTE — Patient Instructions (Addendum)
Here is what we discussed today:    Based on your symptoms, you may have underlying lung disease. You had some wheezing on exam today.   Please talk to Dr. Philip Aspen about seeing a lung specialist, since you have had some evidence of oxygen drop while asleep.  You may have underlying obstructive sleep apnea and I would be happy to check with a sleep study.  You have currently declined a sleep study, if you change your mind, I would be happy to order an overnight sleep test here.   Please continue to work on smoking cessation.   The long-term risks and ramifications of untreated moderate to severe obstructive sleep apnea are: increased Cardiovascular disease, including congestive heart failure, stroke, difficult to control hypertension, treatment resistant obesity, arrhythmias, especially irregular heartbeat commonly known as A. Fib. (atrial fibrillation); even type 2 diabetes has been linked to untreated OSA.   I would be happy to see you back if needed.

## 2020-03-06 DIAGNOSIS — H409 Unspecified glaucoma: Secondary | ICD-10-CM | POA: Diagnosis not present

## 2020-03-06 DIAGNOSIS — E210 Primary hyperparathyroidism: Secondary | ICD-10-CM | POA: Diagnosis not present

## 2020-03-06 DIAGNOSIS — E559 Vitamin D deficiency, unspecified: Secondary | ICD-10-CM | POA: Diagnosis not present

## 2020-03-06 DIAGNOSIS — F17200 Nicotine dependence, unspecified, uncomplicated: Secondary | ICD-10-CM | POA: Diagnosis not present

## 2020-03-06 DIAGNOSIS — E782 Mixed hyperlipidemia: Secondary | ICD-10-CM | POA: Diagnosis not present

## 2020-03-06 DIAGNOSIS — M810 Age-related osteoporosis without current pathological fracture: Secondary | ICD-10-CM | POA: Diagnosis not present

## 2020-03-06 DIAGNOSIS — J439 Emphysema, unspecified: Secondary | ICD-10-CM | POA: Diagnosis not present

## 2020-03-06 DIAGNOSIS — R2681 Unsteadiness on feet: Secondary | ICD-10-CM | POA: Diagnosis not present

## 2020-03-06 DIAGNOSIS — G4730 Sleep apnea, unspecified: Secondary | ICD-10-CM | POA: Diagnosis not present

## 2020-03-06 DIAGNOSIS — D751 Secondary polycythemia: Secondary | ICD-10-CM | POA: Diagnosis not present

## 2020-03-06 DIAGNOSIS — I10 Essential (primary) hypertension: Secondary | ICD-10-CM | POA: Diagnosis not present

## 2020-03-09 DIAGNOSIS — E782 Mixed hyperlipidemia: Secondary | ICD-10-CM | POA: Diagnosis not present

## 2020-03-29 DIAGNOSIS — Z23 Encounter for immunization: Secondary | ICD-10-CM | POA: Diagnosis not present

## 2020-04-08 ENCOUNTER — Ambulatory Visit: Payer: Medicare Other | Attending: Internal Medicine

## 2020-04-08 DIAGNOSIS — Z23 Encounter for immunization: Secondary | ICD-10-CM

## 2020-04-08 NOTE — Progress Notes (Signed)
   Covid-19 Vaccination Clinic  Name:  Alyssa Howard    MRN: [id number] DOB: 1940-11-01  04/08/2020  Alyssa Howard was observed post Covid-19 immunization for 15 minutes without incident. She was provided with Vaccine Information Sheet and instruction to access the V-Safe system.   Alyssa Howard was instructed to call [phone] with any severe reactions post vaccine: Marland Kitchen Difficulty breathing  . Swelling of face and throat  . A fast heartbeat  . A bad rash all over body  . Dizziness and weakness

## 2020-08-29 DIAGNOSIS — E782 Mixed hyperlipidemia: Secondary | ICD-10-CM | POA: Diagnosis not present

## 2020-08-29 DIAGNOSIS — E559 Vitamin D deficiency, unspecified: Secondary | ICD-10-CM | POA: Diagnosis not present

## 2020-09-04 DIAGNOSIS — H524 Presbyopia: Secondary | ICD-10-CM | POA: Diagnosis not present

## 2020-09-04 DIAGNOSIS — H04203 Unspecified epiphora, bilateral lacrimal glands: Secondary | ICD-10-CM | POA: Diagnosis not present

## 2020-09-04 DIAGNOSIS — H11823 Conjunctivochalasis, bilateral: Secondary | ICD-10-CM | POA: Diagnosis not present

## 2020-09-04 DIAGNOSIS — H26493 Other secondary cataract, bilateral: Secondary | ICD-10-CM | POA: Diagnosis not present

## 2020-09-04 DIAGNOSIS — H353132 Nonexudative age-related macular degeneration, bilateral, intermediate dry stage: Secondary | ICD-10-CM | POA: Diagnosis not present

## 2020-09-04 DIAGNOSIS — H52223 Regular astigmatism, bilateral: Secondary | ICD-10-CM | POA: Diagnosis not present

## 2020-09-04 DIAGNOSIS — H0100A Unspecified blepharitis right eye, upper and lower eyelids: Secondary | ICD-10-CM | POA: Diagnosis not present

## 2020-09-05 DIAGNOSIS — G4730 Sleep apnea, unspecified: Secondary | ICD-10-CM | POA: Diagnosis not present

## 2020-09-05 DIAGNOSIS — H6122 Impacted cerumen, left ear: Secondary | ICD-10-CM | POA: Diagnosis not present

## 2020-09-05 DIAGNOSIS — D751 Secondary polycythemia: Secondary | ICD-10-CM | POA: Diagnosis not present

## 2020-09-05 DIAGNOSIS — M810 Age-related osteoporosis without current pathological fracture: Secondary | ICD-10-CM | POA: Diagnosis not present

## 2020-09-05 DIAGNOSIS — H6121 Impacted cerumen, right ear: Secondary | ICD-10-CM | POA: Diagnosis not present

## 2020-09-05 DIAGNOSIS — E782 Mixed hyperlipidemia: Secondary | ICD-10-CM | POA: Diagnosis not present

## 2020-09-05 DIAGNOSIS — J439 Emphysema, unspecified: Secondary | ICD-10-CM | POA: Diagnosis not present

## 2020-09-05 DIAGNOSIS — R011 Cardiac murmur, unspecified: Secondary | ICD-10-CM | POA: Diagnosis not present

## 2020-09-05 DIAGNOSIS — Z0000 Encounter for general adult medical examination without abnormal findings: Secondary | ICD-10-CM | POA: Diagnosis not present

## 2020-09-05 DIAGNOSIS — I10 Essential (primary) hypertension: Secondary | ICD-10-CM | POA: Diagnosis not present

## 2020-09-05 DIAGNOSIS — Z1339 Encounter for screening examination for other mental health and behavioral disorders: Secondary | ICD-10-CM | POA: Diagnosis not present

## 2020-09-05 DIAGNOSIS — E559 Vitamin D deficiency, unspecified: Secondary | ICD-10-CM | POA: Diagnosis not present

## 2020-09-05 DIAGNOSIS — Z1331 Encounter for screening for depression: Secondary | ICD-10-CM | POA: Diagnosis not present

## 2020-09-05 DIAGNOSIS — F17200 Nicotine dependence, unspecified, uncomplicated: Secondary | ICD-10-CM | POA: Diagnosis not present

## 2020-09-05 DIAGNOSIS — R82998 Other abnormal findings in urine: Secondary | ICD-10-CM | POA: Diagnosis not present

## 2020-10-15 DIAGNOSIS — Z23 Encounter for immunization: Secondary | ICD-10-CM | POA: Diagnosis not present

## 2020-10-30 ENCOUNTER — Emergency Department (HOSPITAL_BASED_OUTPATIENT_CLINIC_OR_DEPARTMENT_OTHER)
Admission: EM | Admit: 2020-10-30 | Discharge: 2020-10-30 | Disposition: A | Discharge status: Home / Self Care | Payer: Medicare Other | Attending: Emergency Medicine | Admitting: Emergency Medicine

## 2020-10-30 ENCOUNTER — Emergency Department (HOSPITAL_BASED_OUTPATIENT_CLINIC_OR_DEPARTMENT_OTHER): Payer: Medicare Other

## 2020-10-30 ENCOUNTER — Other Ambulatory Visit: Payer: Self-pay

## 2020-10-30 DIAGNOSIS — F17210 Nicotine dependence, cigarettes, uncomplicated: Secondary | ICD-10-CM | POA: Diagnosis not present

## 2020-10-30 DIAGNOSIS — Y92000 Kitchen of unspecified non-institutional (private) residence as  the place of occurrence of the external cause: Secondary | ICD-10-CM | POA: Insufficient documentation

## 2020-10-30 DIAGNOSIS — I10 Essential (primary) hypertension: Secondary | ICD-10-CM | POA: Insufficient documentation

## 2020-10-30 DIAGNOSIS — S52502A Unspecified fracture of the lower end of left radius, initial encounter for closed fracture: Secondary | ICD-10-CM | POA: Diagnosis not present

## 2020-10-30 DIAGNOSIS — S6992XA Unspecified injury of left wrist, hand and finger(s), initial encounter: Secondary | ICD-10-CM | POA: Diagnosis present

## 2020-10-30 DIAGNOSIS — S0990XA Unspecified injury of head, initial encounter: Secondary | ICD-10-CM | POA: Insufficient documentation

## 2020-10-30 DIAGNOSIS — S51812A Laceration without foreign body of left forearm, initial encounter: Secondary | ICD-10-CM | POA: Diagnosis not present

## 2020-10-30 DIAGNOSIS — S52352A Displaced comminuted fracture of shaft of radius, left arm, initial encounter for closed fracture: Secondary | ICD-10-CM | POA: Diagnosis not present

## 2020-10-30 DIAGNOSIS — S0993XA Unspecified injury of face, initial encounter: Secondary | ICD-10-CM | POA: Diagnosis not present

## 2020-10-30 DIAGNOSIS — W1839XA Other fall on same level, initial encounter: Secondary | ICD-10-CM | POA: Insufficient documentation

## 2020-10-30 DIAGNOSIS — S01511A Laceration without foreign body of lip, initial encounter: Secondary | ICD-10-CM | POA: Diagnosis not present

## 2020-10-30 MED ORDER — HYDROCODONE-ACETAMINOPHEN 5-325 MG PO TABS: 1.0000 | ORAL_TABLET | ORAL | 0 refills | Status: DC | PRN

## 2020-10-30 NOTE — ED Provider Notes (Signed)
[facility] Emergency Department Provider Note MRN:  [id number]  Arrival date & time: 10/30/20     Chief Complaint   Fall and Wrist Pain   History of Present Illness   Alyssa Howard is a 80 y.o. year-old female with a history of HTN presenting to the ED with chief complaint of fall.  Patient explains she was walking to the kitchen to get a snack and thinks that she may have stumbled and fell.  She also has a history of frequent falls related to "an inner ear issue" and balance problems.  The balance issues and frequent falls have been present for years.  Does not think that she passed out.  Denies any head trauma though her lip is bleeding.  Denies neck or back pain, no chest pain or shortness of breath.  Endorsing pain to the left wrist, mild to moderate, worse with motion or palpation.  Review of Systems  A complete 10 system review of systems was obtained and all systems are negative except as noted in the HPI and PMH.   Patient's Health History    Past Medical History:  Diagnosis Date  . Dry eyes   . Emphysema lung ([place])   . Hypercholesterolemia   . Hypertension   . Neck pain   . Osteopenia   . Osteopenia   . Syncope and collapse   . Vitamin D deficiency     Past Surgical History:  Procedure Laterality Date  . CATARACT EXTRACTION    . CATARACT EXTRACTION    . SQUAMOUS CELL CARCINOMA EXCISION     Lip    Family History  Problem Relation Age of Onset  . Diabetes Mother   . Stroke Mother   . Melanoma Sister   . Heart disease Brother   . Alzheimer's disease Brother     Social History   Socioeconomic History  . Marital status: Married    Spouse name: Not on file  . Number of children: Not on file  . Years of education: Not on file  . Highest education level: Not on file  Occupational History  . Not on file  Tobacco Use  . Smoking status: Current Every Day Smoker    Packs/day: 0.50    Types: Cigarettes  . Smokeless tobacco: Never  Used  Vaping Use  . Vaping Use: Never used  Substance and Sexual Activity  . Alcohol use: No  . Drug use: No  . Sexual activity: Yes  Other Topics Concern  . Not on file  Social History Narrative  . Not on file   Social Determinants of Health   Financial Resource Strain: Not on file  Food Insecurity: Not on file  Transportation Needs: Not on file  Physical Activity: Not on file  Stress: Not on file  Social Connections: Not on file  Intimate Partner Violence: Not on file     Physical Exam   Vitals:   10/30/20 0254  BP: 139/74  Pulse: 76  Resp: 18  Temp: 98.4 F (36.9 C)  SpO2: 100%    CONSTITUTIONAL: Well-appearing, NAD NEURO:  Alert and oriented x 3, no focal deficits EYES:  eyes equal and reactive ENT/NECK:  no LAD, no JVD CARDIO: Regular rate, well-perfused, normal S1 and S2 PULM:  CTAB no wheezing or rhonchi GI/GU:  normal bowel sounds, non-distended, non-tender MSK/SPINE: Bruising and swelling with possible deformity to left wrist, left hand is neurovascular intact SKIN: Skin tear to the left posterior forearm, small superficial laceration  to anterior aspect of left upper lip PSYCH:  Appropriate speech and behavior  *Additional and/or pertinent findings included in MDM below  Diagnostic and Interventional Summary    EKG Interpretation  Date/Time:  Monday Oct 30 2020 03:09:42 EDT Ventricular Rate:  74 PR Interval:  159 QRS Duration: 82 QT Interval:  384 QTC Calculation: 426 R Axis:   73 Text Interpretation: Sinus rhythm Left atrial enlargement No significant change was found Confirmed by Gerlene Fee [id number]) on 10/30/2020 3:12:29 AM      Labs Reviewed - No data to display  CT HEAD WO CONTRAST  Final Result    DG Wrist Complete Left  Final Result      Medications - No data to display   Procedures  /  Critical Care Procedures .Splint Application  Date/Time: 10/30/2020 3:50 AM Performed by: Maudie Flakes, MD Authorized by: Maudie Flakes,  MD   Consent:    Consent obtained:  Verbal   Consent given by:  Patient   Risks, benefits, and alternatives were discussed: yes   Universal protocol:    Procedure explained and questions answered to patient or proxy's satisfaction: yes     Patient identity confirmed:  Verbally with patient Pre-procedure details:    Distal neurologic exam:  Normal   Distal perfusion: distal pulses strong   Procedure details:    Location:  Wrist   Wrist location:  L wrist   Splint type:  Sugar tong   Supplies:  Cotton padding and fiberglass   Attestation: Splint applied and adjusted personally by me   Post-procedure details:    Distal neurologic exam:  Normal   Distal perfusion: distal pulses strong, brisk capillary refill and unchanged     Procedure completion:  Tolerated well, no immediate complications   Post-procedure imaging: not applicable      ED Course and Medical Decision Making  I have reviewed the triage vital signs, the nursing notes, and pertinent available records from the EMR.  Listed above are laboratory and imaging tests that I personally ordered, reviewed, and interpreted and then considered in my medical decision making (see below for details).  Suspect fall due to mechanical reason versus due to patient's chronic dizziness/balance issues.  Will obtain screening EKG but doubt significant underlying medical pathology as the cause of the fall.  Denies any dizziness or chest pain prior to the fall.  Currently with normal neurological exam.  EKG is reassuring, sinus rhythm, no ischemic features.  Imaging of the head and wrist to exclude intracranial bleeding, fracture, etc. tetanus is up-to-date per EMR.     X-ray confirms distal radius fracture, CT head is reassuring, appropriate for sugar-tong and follow-up with hand surgery.  [name]. [name], [name] [email or web address]  Final Clinical Impressions(s) / ED Diagnoses      ICD-10-CM   1. Closed fracture of distal end of left radius, unspecified fracture morphology, initial encounter  S52.502A     ED Discharge Orders         Ordered    HYDROcodone-acetaminophen (NORCO/VICODIN) 5-325 MG tablet  Every 4 hours PRN        10/30/20 0348           Discharge Instructions Discussed with and Provided to Patient:     Discharge Instructions     You were evaluated in the Emergency Department and after careful evaluation, we did not find any emergent condition requiring admission or further testing in the hospital.  Your x-rays show a broken bone in your wrist.  Please keep the splint on, keep it clean and dry, and follow-up with the orthopedic specialists within the next 1 to 2 weeks.  Recommend Tylenol or Motrin at home for pain.  For significant pain keeping you from sleeping, you can use the Norco tablets provided.  Please return to the Emergency Department if you experience any worsening of your condition.  Thank you for allowing [place] to be a part of your care.        Maudie Flakes, MD 10/30/20 [phone]

## 2020-10-30 NOTE — ED Notes (Signed)
Pt verbalizes understanding of discharge instructions. Opportunity for questioning and answers were provided. Armand removed by staff, pt discharged from ED to home. Educated to pick up Rx and to call ortho in the morning for splint.

## 2020-10-30 NOTE — ED Provider Notes (Incomplete)
[facility] Emergency Department Provider Note MRN:  [id number]  Arrival date & time: 10/30/20     Chief Complaint   Fall and Wrist Pain   History of Present Illness   Alyssa Howard is a 80 y.o. year-old female with a history of HTN presenting to the ED with chief complaint of fall.  Patient explains she was walking to the kitchen to get a snack and thinks that she may have stumbled and fell.  She also has a history of frequent falls related to "an inner ear issue" and balance problems.  The balance issues and frequent falls have been present for years.  Does not think that she passed out.  Denies any head trauma though her lip is bleeding.  Denies neck or back pain, no chest pain or shortness of breath.  Endorsing pain to the left wrist, mild to moderate, worse with motion or palpation.  Review of Systems  A complete 10 system review of systems was obtained and all systems are negative except as noted in the HPI and PMH.   Patient's Health History    Past Medical History:  Diagnosis Date  . Dry eyes   . Emphysema lung ([place])   . Hypercholesterolemia   . Hypertension   . Neck pain   . Osteopenia   . Osteopenia   . Syncope and collapse   . Vitamin D deficiency     Past Surgical History:  Procedure Laterality Date  . CATARACT EXTRACTION    . CATARACT EXTRACTION    . SQUAMOUS CELL CARCINOMA EXCISION     Lip    Family History  Problem Relation Age of Onset  . Diabetes Mother   . Stroke Mother   . Melanoma Sister   . Heart disease Brother   . Alzheimer's disease Brother     Social History   Socioeconomic History  . Marital status: Married    Spouse name: Not on file  . Number of children: Not on file  . Years of education: Not on file  . Highest education level: Not on file  Occupational History  . Not on file  Tobacco Use  . Smoking status: Current Every Day Smoker    Packs/day: 0.50    Types: Cigarettes  . Smokeless tobacco: Never  Used  Vaping Use  . Vaping Use: Never used  Substance and Sexual Activity  . Alcohol use: No  . Drug use: No  . Sexual activity: Yes  Other Topics Concern  . Not on file  Social History Narrative  . Not on file   Social Determinants of Health   Financial Resource Strain: Not on file  Food Insecurity: Not on file  Transportation Needs: Not on file  Physical Activity: Not on file  Stress: Not on file  Social Connections: Not on file  Intimate Partner Violence: Not on file     Physical Exam   Vitals:   10/30/20 0254  BP: 139/74  Pulse: 76  Resp: 18  Temp: 98.4 F (36.9 C)  SpO2: 100%    CONSTITUTIONAL:  ***-appearing, NAD NEURO:  Alert and oriented x 3, no focal deficits EYES:  eyes equal and reactive ENT/NECK:  no LAD, no JVD CARDIO:  *** rate, well-perfused, normal S1 and S2 PULM:  CTAB no wheezing or rhonchi GI/GU:  normal bowel sounds, non-distended, non-tender MSK/SPINE:  No gross deformities, no edema SKIN:  no rash, atraumatic*** PSYCH:  Appropriate speech and behavior  *Additional and/or pertinent findings included  in MDM below  Diagnostic and Interventional Summary    EKG Interpretation  Date/Time:    Ventricular Rate:    PR Interval:    QRS Duration:   QT Interval:    QTC Calculation:   R Axis:     Text Interpretation:       *** Labs Reviewed - No data to display  CT HEAD WO CONTRAST    (Results Pending)  DG Wrist Complete Left    (Results Pending)    Medications - No data to display   Procedures  /  Critical Care Procedures  ED Course and Medical Decision Making  I have reviewed the triage vital signs, the nursing notes, and pertinent available records from the EMR.  Listed above are laboratory and imaging tests that I personally ordered, reviewed, and interpreted and then considered in my medical decision making (see below for details).  ***     ***  [name]. [name], [place] [email or web address]  Final Clinical Impressions(s) / ED Diagnoses  No diagnosis found.  ED Discharge Orders    None       Discharge Instructions Discussed with and Provided to Patient:   Discharge Instructions   None

## 2020-10-30 NOTE — ED Triage Notes (Signed)
Pt was walking with her cane to the kitchen and fell onto a metal vent. Pt is c/o left wrist pain and left forearm skin tears. Pt has blood noted to her mouth but denies hitting her head. Denies being on blood thinners.

## 2020-10-30 NOTE — Discharge Instructions (Addendum)
You were evaluated in the Emergency Department and after careful evaluation, we did not find any emergent condition requiring admission or further testing in the hospital.  Your x-rays show a broken bone in your wrist.  Please keep the splint on, keep it clean and dry, and follow-up with the orthopedic specialists within the next 1 to 2 weeks.  Recommend Tylenol or Motrin at home for pain.  For significant pain keeping you from sleeping, you can use the Norco tablets provided.  Please return to the Emergency Department if you experience any worsening of your condition.  Thank you for allowing [place] to be a part of your care.

## 2020-11-03 ENCOUNTER — Emergency Department (HOSPITAL_BASED_OUTPATIENT_CLINIC_OR_DEPARTMENT_OTHER)
Admission: EM | Admit: 2020-11-03 | Discharge: 2020-11-03 | Disposition: A | Discharge status: Home / Self Care | Payer: Medicare Other | Attending: Emergency Medicine | Admitting: Emergency Medicine

## 2020-11-03 ENCOUNTER — Other Ambulatory Visit: Payer: Self-pay

## 2020-11-03 DIAGNOSIS — F17210 Nicotine dependence, cigarettes, uncomplicated: Secondary | ICD-10-CM | POA: Diagnosis not present

## 2020-11-03 DIAGNOSIS — S60222A Contusion of left hand, initial encounter: Secondary | ICD-10-CM | POA: Diagnosis not present

## 2020-11-03 DIAGNOSIS — X58XXXA Exposure to other specified factors, initial encounter: Secondary | ICD-10-CM | POA: Diagnosis not present

## 2020-11-03 DIAGNOSIS — R2232 Localized swelling, mass and lump, left upper limb: Secondary | ICD-10-CM | POA: Diagnosis not present

## 2020-11-03 DIAGNOSIS — M7989 Other specified soft tissue disorders: Secondary | ICD-10-CM

## 2020-11-03 DIAGNOSIS — I10 Essential (primary) hypertension: Secondary | ICD-10-CM | POA: Insufficient documentation

## 2020-11-03 DIAGNOSIS — Z8781 Personal history of (healed) traumatic fracture: Secondary | ICD-10-CM | POA: Diagnosis not present

## 2020-11-03 DIAGNOSIS — Z85828 Personal history of other malignant neoplasm of skin: Secondary | ICD-10-CM | POA: Insufficient documentation

## 2020-11-03 NOTE — ED Notes (Addendum)
Pt verbalizes understanding of discharge instructions. Opportunity for questioning and answers were provided. Armand removed by staff, pt discharged from ED to home. Pt instructed to keep sling above heart and to follow ortho on Tuesday.

## 2020-11-03 NOTE — ED Triage Notes (Signed)
Pt had a splint placed last week for left radial fracture. Pt is present to the ED for concern of swollen fingers that have also turned purple over the last few days. Pt denies pain, she wants to make sure everything is okay with her splint. Ortho appointment is on Tuesday. Pt has good cap refill during triage.

## 2020-11-03 NOTE — ED Provider Notes (Signed)
[facility] EMERGENCY DEPT Provider Note   CSN: [id number] Arrival date & time: 11/03/20  1956     History Chief Complaint  Patient presents with  . Joint Swelling    Splint    Alyssa Howard is a 80 y.o. female.  Patient is a 80 year old female who recently and sustained a distal radius fracture in her left wrist.  This occurred on May 9.  She has an appointment to follow-up with an orthopedic surgeon on Tuesday which is in 4 days.  She had a sugar-tong splint placed and an Ace wrap.  She has a sling.  She says that over the last couple days she has noticed that her hand is swollen.  It seems to be worse in the morning when she wakes up gets a little bit better through the day.  She also noticed some bruising in her hand and just wanted to come in to make sure it was okay.  She actually does not complain of any pain.  She says that it feels okay in the splint and she does not have any increased pain.  No fevers.  No numbness or weakness in the fingers.        Past Medical History:  Diagnosis Date  . Dry eyes   . Emphysema lung ([place])   . Hypercholesterolemia   . Hypertension   . Neck pain   . Osteopenia   . Osteopenia   . Syncope and collapse   . Vitamin D deficiency     Patient Active Problem List   Diagnosis Date Noted  . HYPERLIPIDEMIA 01/01/2007  . TOBACCO USER 01/01/2007    Past Surgical History:  Procedure Laterality Date  . CATARACT EXTRACTION    . CATARACT EXTRACTION    . SQUAMOUS CELL CARCINOMA EXCISION     Lip     OB History   No obstetric history on file.     Family History  Problem Relation Age of Onset  . Diabetes Mother   . Stroke Mother   . Melanoma Sister   . Heart disease Brother   . Alzheimer's disease Brother     Social History   Tobacco Use  . Smoking status: Current Every Day Smoker    Packs/day: 0.50    Types: Cigarettes  . Smokeless tobacco: Never Used  Vaping Use  . Vaping Use: Never used  Substance Use  Topics  . Alcohol use: No  . Drug use: No    Home Medications Prior to Admission medications   Medication Sig Start Date End Date Taking? Authorizing Provider  cholecalciferol (VITAMIN D) 1000 units tablet Take 1,000 Units by mouth daily.    [provider]  HYDROcodone-acetaminophen (NORCO/VICODIN) 5-325 MG tablet Take 1 tablet by mouth every 4 (four) hours as needed. 10/30/20   Maudie Flakes, MD    Allergies    Penicillins and Zetia [ezetimibe]  Review of Systems   Review of Systems  Constitutional: Negative for fever.  Gastrointestinal: Negative for nausea and vomiting.  Musculoskeletal: Positive for arthralgias and joint swelling. Negative for back pain and neck pain.  Skin: Negative for wound.  Neurological: Negative for weakness, numbness and headaches.    Physical Exam Updated Vital Signs BP 127/89 (BP Location: Right Arm)   Pulse (!) 102   Temp 98.1 F (36.7 C) (Oral)   Resp 16   SpO2 97%   Physical Exam Constitutional:      Appearance: She is well-developed.  HENT:  Head: Normocephalic and atraumatic.  Cardiovascular:     Rate and Rhythm: Normal rate.  Pulmonary:     Effort: Pulmonary effort is normal.  Musculoskeletal:     Cervical back: Normal range of motion and neck supple.     Comments: Patient is in a sugar-tong splint.  The Ace wrap was removed and was reapplied a little bit looser.  She does have some swelling of the fingers of the left hand.  She has full range of motion the fingers.  They are warm.  No increased redness.  She does have some ecchymosis.  She has normal sensation and motor function in the fingers.  Capillary refill is normal.  Skin:    General: Skin is warm and dry.  Neurological:     Mental Status: She is alert and oriented to person, place, and time.     ED Results / Procedures / Treatments   Labs (all labs ordered are listed, but only abnormal results are displayed) Labs Reviewed - No data to  display  EKG None  Radiology No results found.  Procedures Procedures   Medications Ordered in ED Medications - No data to display  ED Course  I have reviewed the triage vital signs and the nursing notes.  Pertinent labs & imaging results that were available during my care of the patient were reviewed by me and considered in my medical decision making (see chart for details).    MDM Rules/Calculators/A&P                          Patient presents with some swelling and ecchymosis to the left hand after sustaining a distal radius fracture.  I suspect its a combination of the injury and the splint.  She also says that she is sleeping on her right side with her left arm hanging down.  This could be contributing to her swelling that she wakes up with in the morning.  She does not have any signs of compartment syndrome.  No increasing pain or suggestions of infection.  The Ace wrap was loosened slightly.  The sling was readjusted.  It was suggested that she try sleeping in a recliner with her arm supported on pillows.  She has an appointment to follow-up with the orthopedist on Tuesday.  Return precautions were given. Final Clinical Impression(s) / ED Diagnoses Final diagnoses:  Swelling of left hand    Rx / DC Orders ED Discharge Orders    None       Alyssa Johns, MD 11/03/20 2048

## 2020-11-07 ENCOUNTER — Ambulatory Visit (INDEPENDENT_AMBULATORY_CARE_PROVIDER_SITE_OTHER): Payer: Medicare Other | Admitting: Orthopaedic Surgery

## 2020-11-07 ENCOUNTER — Other Ambulatory Visit: Payer: Self-pay

## 2020-11-07 ENCOUNTER — Ambulatory Visit (INDEPENDENT_AMBULATORY_CARE_PROVIDER_SITE_OTHER): Payer: Medicare Other

## 2020-11-07 DIAGNOSIS — S52532A Colles' fracture of left radius, initial encounter for closed fracture: Secondary | ICD-10-CM | POA: Diagnosis not present

## 2020-11-07 NOTE — Progress Notes (Signed)
Office Visit Note   Patient: Alyssa Howard           Date of Birth: 05-May-1941           MRN: [id number] Visit Date: 11/07/2020              Requested by: Leanna Battles, MD [address] [place],  [place] [address] PCP: Leanna Battles, MD   Assessment & Plan: Visit Diagnoses:  1. Closed Colles' fracture of left radius, initial encounter     Plan: Impression is nondisplaced distal radius fracture.  We will immobilize in a short arm cast for anticipated 3 weeks.  Nonweightbearing.  Elevate at all times.  Follow-up in 3 weeks with two-view x-rays of the left wrist out of the cast.  Follow-Up Instructions: Return in about 3 weeks (around 11/28/2020).   Orders:  Orders Placed This Encounter  Procedures  . XR Wrist 2 Views Left   No orders of the defined types were placed in this encounter.     Procedures: No procedures performed   Clinical Data: No additional findings.   Subjective: Chief Complaint  Patient presents with  . Left Wrist - Injury    [name] is a very pleasant 80 year old female referral from the [facility] ER for left distal radius fracture from mechanical fall on 10/30/2020.  She got up in the middle night and fell in the kitchen.  She sustained some skin tears as well.   Review of Systems  Constitutional: Negative.   HENT: Negative.   Eyes: Negative.   Respiratory: Negative.   Cardiovascular: Negative.   Endocrine: Negative.   Musculoskeletal: Negative.   Neurological: Negative.   Hematological: Negative.   Psychiatric/Behavioral: Negative.   All other systems reviewed and are negative.    Objective: Vital Signs: There were no vitals taken for this visit.  Physical Exam Vitals and nursing note reviewed.  Constitutional:      Appearance: She is well-developed.  HENT:     Head: Normocephalic and atraumatic.  Pulmonary:     Effort: Pulmonary effort is normal.  Abdominal:     Palpations: Abdomen is soft.  Musculoskeletal:      Cervical back: Neck supple.  Skin:    General: Skin is warm.     Capillary Refill: Capillary refill takes less than 2 seconds.  Neurological:     Mental Status: She is alert and oriented to person, place, and time.  Psychiatric:        Behavior: Behavior normal.        Thought Content: Thought content normal.        Judgment: Judgment normal.     Ortho Exam Left wrist shows moderate swelling.  No neurovascular compromise.  Light bruising throughout the wrist and some superficial skin tears on the ulnar side of the forearm. Specialty Comments:  No specialty comments available.  Imaging: XR Wrist 2 Views Left  Result Date: 11/07/2020 Maintained nondisplaced distal radius fracture.    PMFS History: Patient Active Problem List   Diagnosis Date Noted  . Fracture, Colles, left, closed 11/07/2020  . HYPERLIPIDEMIA 01/01/2007  . TOBACCO USER 01/01/2007   Past Medical History:  Diagnosis Date  . Dry eyes   . Emphysema lung ([place])   . Hypercholesterolemia   . Hypertension   . Neck pain   . Osteopenia   . Osteopenia   . Syncope and collapse   . Vitamin D deficiency     Family History  Problem Relation Age of Onset  .  Diabetes Mother   . Stroke Mother   . Melanoma Sister   . Heart disease Brother   . Alzheimer's disease Brother     Past Surgical History:  Procedure Laterality Date  . CATARACT EXTRACTION    . CATARACT EXTRACTION    . SQUAMOUS CELL CARCINOMA EXCISION     Lip   Social History   Occupational History  . Not on file  Tobacco Use  . Smoking status: Current Every Day Smoker    Packs/day: 0.50    Types: Cigarettes  . Smokeless tobacco: Never Used  Vaping Use  . Vaping Use: Never used  Substance and Sexual Activity  . Alcohol use: No  . Drug use: No  . Sexual activity: Yes

## 2020-11-28 ENCOUNTER — Ambulatory Visit (INDEPENDENT_AMBULATORY_CARE_PROVIDER_SITE_OTHER): Payer: Medicare Other

## 2020-11-28 ENCOUNTER — Ambulatory Visit (INDEPENDENT_AMBULATORY_CARE_PROVIDER_SITE_OTHER): Payer: Medicare Other | Admitting: Orthopaedic Surgery

## 2020-11-28 DIAGNOSIS — S52532A Colles' fracture of left radius, initial encounter for closed fracture: Secondary | ICD-10-CM

## 2020-11-28 NOTE — Progress Notes (Signed)
   Office Visit Note   Patient: Alyssa Howard           Date of Birth: [birth year]           MRN: [id number] Visit Date: 11/28/2020              Requested by: Leanna Battles, MD [address] [place],  [place] [address] PCP: Leanna Battles, MD   Assessment & Plan: Visit Diagnoses:  1. Closed Colles' fracture of left radius, initial encounter     Plan: Distal radius fracture is healing appropriately.  I have given her referral to [facility] re[facility] at [facility].  Short wrist brace given today.  We will see her back in 6 weeks with repeat two-view x-rays of the left wrist.  Follow-Up Instructions: Return in about 6 weeks (around 01/09/2021).   Orders:  Orders Placed This Encounter  Procedures  . XR Wrist 2 Views Left   No orders of the defined types were placed in this encounter.     Procedures: No procedures performed   Clinical Data: No additional findings.   Subjective: Chief Complaint  Patient presents with  . Left Wrist - Follow-up    [name] returns today for 1 month follow-up for left distal radius fracture.  Date of injury was 10/30/2020.  She has been in a short arm cast.   Review of Systems   Objective: Vital Signs: There were no vitals taken for this visit.  Physical Exam  Ortho Exam Left wrist shows mild swelling and bruising.  No neurovascular compromise.  Gentle range of motion produces no pain. Specialty Comments:  No specialty comments available.  Imaging: XR Wrist 2 Views Left  Result Date: 11/28/2020 Healing distal radius fracture without any complications    PMFS History: Patient Active Problem List   Diagnosis Date Noted  . Fracture, Colles, left, closed 11/07/2020  . HYPERLIPIDEMIA 01/01/2007  . TOBACCO USER 01/01/2007   Past Medical History:  Diagnosis Date  . Dry eyes   . Emphysema lung ([place])   . Hypercholesterolemia   . Hypertension   . Neck pain   . Osteopenia   . Osteopenia   . Syncope and collapse   .  Vitamin D deficiency     Family History  Problem Relation Age of Onset  . Diabetes Mother   . Stroke Mother   . Melanoma Sister   . Heart disease Brother   . Alzheimer's disease Brother     Past Surgical History:  Procedure Laterality Date  . CATARACT EXTRACTION    . CATARACT EXTRACTION    . SQUAMOUS CELL CARCINOMA EXCISION     Lip   Social History   Occupational History  . Not on file  Tobacco Use  . Smoking status: Current Every Day Smoker    Packs/day: 0.50    Types: Cigarettes  . Smokeless tobacco: Never Used  Vaping Use  . Vaping Use: Never used  Substance and Sexual Activity  . Alcohol use: No  . Drug use: No  . Sexual activity: Yes

## 2020-12-26 DIAGNOSIS — Z20822 Contact with and (suspected) exposure to covid-19: Secondary | ICD-10-CM | POA: Diagnosis not present

## 2021-01-09 ENCOUNTER — Ambulatory Visit (INDEPENDENT_AMBULATORY_CARE_PROVIDER_SITE_OTHER): Payer: Medicare Other

## 2021-01-09 ENCOUNTER — Ambulatory Visit (INDEPENDENT_AMBULATORY_CARE_PROVIDER_SITE_OTHER): Payer: Medicare Other | Admitting: Orthopaedic Surgery

## 2021-01-09 ENCOUNTER — Encounter: Payer: Self-pay | Admitting: Orthopaedic Surgery

## 2021-01-09 DIAGNOSIS — S52532A Colles' fracture of left radius, initial encounter for closed fracture: Secondary | ICD-10-CM | POA: Diagnosis not present

## 2021-01-09 NOTE — Progress Notes (Signed)
Office Visit Note   Patient: Alyssa Howard           Date of Birth: 07-07-1940           MRN: [id number] Visit Date: 01/09/2021              Requested by: Leanna Battles, MD [address] [place],  [place] [address] PCP: Leanna Battles, MD   Assessment & Plan: Visit Diagnoses:  1. Closed Colles' fracture of left radius, initial encounter     Plan: Impression is 10-week status post left distal radius fracture with continued healing.  At this point, believe the patient symptoms are primarily due to slight stiffness at the wrist.  We have discussed that the go ahead and start hand therapy.  They are agreeable to this plan.  She no longer needs to wear her wrist splint.  She will avoid any heavy lifting for now.  Follow-up with [place] in 6 weeks time for repeat evaluation and x-rays of the left wrist.  Call with concerns or questions in the meantime.  Follow-Up Instructions: Return in about 6 weeks (around 02/20/2021).   Orders:  Orders Placed This Encounter  Procedures   XR Wrist 2 Views Left   No orders of the defined types were placed in this encounter.     Procedures: No procedures performed   Clinical Data: No additional findings.   Subjective: Chief Complaint  Patient presents with   Left Wrist - Fracture, Follow-up    HPI pleasant 80 year old female who comes in today 10 weeks out left distal radius fracture, date of injury 10/30/2020.  She has been doing well.  She has been compliant wearing her wrist splint at all times.  She has not yet started hand therapy.  She notes mild discomfort along the distal radius and into the thumb.  This has improved over the past few weeks.     Objective: Vital Signs: There were no vitals taken for this visit.    Ortho Exam left wrist exam shows no swelling.  No tenderness to the distal radius.  Mild pain with grinding of the first [place] joint.  No pain with wrist flexion or extension or radial or ulnar deviation.  She is  neurovascularly intact distally.  Specialty Comments:  No specialty comments available.  Imaging: XR Wrist 2 Views Left  Result Date: 01/09/2021 X-rays demonstrate a nearly healed distal radius fracture with continued consolidation    PMFS History: Patient Active Problem List   Diagnosis Date Noted   Fracture, Colles, left, closed 11/07/2020   HYPERLIPIDEMIA 01/01/2007   TOBACCO USER 01/01/2007   Past Medical History:  Diagnosis Date   Dry eyes    Emphysema lung ([place])    Hypercholesterolemia    Hypertension    Neck pain    Osteopenia    Osteopenia    Syncope and collapse    Vitamin D deficiency     Family History  Problem Relation Age of Onset   Diabetes Mother    Stroke Mother    Melanoma Sister    Heart disease Brother    Alzheimer's disease Brother     Past Surgical History:  Procedure Laterality Date   CATARACT EXTRACTION     CATARACT EXTRACTION     SQUAMOUS CELL CARCINOMA EXCISION     Lip   Social History   Occupational History   Not on file  Tobacco Use   Smoking status: Every Day    Packs/day: 0.50  Types: Cigarettes   Smokeless tobacco: Never  Vaping Use   Vaping Use: Never used  Substance and Sexual Activity   Alcohol use: No   Drug use: No   Sexual activity: Yes

## 2021-01-15 ENCOUNTER — Telehealth: Payer: Self-pay | Admitting: Orthopaedic Surgery

## 2021-01-15 NOTE — Telephone Encounter (Signed)
Pt's husband Sonia Side called requesting a call back from [place]. Pt re injury her left wrist and need medical advice. Pt's husband was told Dr. Erlinda Hong is in surgery so they would like to speak with Kathlee Nations. Phone number is [phone].

## 2021-01-16 ENCOUNTER — Ambulatory Visit (INDEPENDENT_AMBULATORY_CARE_PROVIDER_SITE_OTHER): Payer: Medicare Other | Admitting: Orthopaedic Surgery

## 2021-01-16 ENCOUNTER — Ambulatory Visit (INDEPENDENT_AMBULATORY_CARE_PROVIDER_SITE_OTHER): Payer: Medicare Other

## 2021-01-16 ENCOUNTER — Other Ambulatory Visit: Payer: Self-pay

## 2021-01-16 DIAGNOSIS — S52532A Colles' fracture of left radius, initial encounter for closed fracture: Secondary | ICD-10-CM

## 2021-01-16 NOTE — Progress Notes (Signed)
      Patient: Alyssa Howard           Date of Birth: 1941-01-24           MRN: [id number] Visit Date: 01/16/2021 PCP: Leanna Battles, MD   Assessment & Plan:  Chief Complaint:  Chief Complaint  Patient presents with   Left Wrist - Follow-up   Visit Diagnoses:  1. Closed Colles' fracture of left radius, initial encounter     Plan: Dalayla comes in today for recent aggravation of left wrist pain.  She picked up some close and couple days afterwards started feeling pain.  She wants reassurance that nothing is abnormal.  Left wrist shows no swelling or tenderness to palpation.  Good range of motion.  X-rays demonstrate healed distal radius fracture without any acute abnormalities.  Reassurance was provided that things look good on x-ray and clinical exam.  We will send her to hand therapy at [facility].  We will see her back as needed.  Follow-Up Instructions: Return if symptoms worsen or fail to improve.   Orders:  Orders Placed This Encounter  Procedures   XR Wrist Complete Left   No orders of the defined types were placed in this encounter.   Imaging: XR Wrist Complete Left  Result Date: 01/16/2021 No acute abnormalities.  Healed distal radius fracture.  Generalized osteopenia.   PMFS History: Patient Active Problem List   Diagnosis Date Noted   Fracture, Colles, left, closed 11/07/2020   HYPERLIPIDEMIA 01/01/2007   TOBACCO USER 01/01/2007   Past Medical History:  Diagnosis Date   Dry eyes    Emphysema lung ([place])    Hypercholesterolemia    Hypertension    Neck pain    Osteopenia    Osteopenia    Syncope and collapse    Vitamin D deficiency     Family History  Problem Relation Age of Onset   Diabetes Mother    Stroke Mother    Melanoma Sister    Heart disease Brother    Alzheimer's disease Brother     Past Surgical History:  Procedure Laterality Date   CATARACT EXTRACTION     CATARACT EXTRACTION     SQUAMOUS CELL CARCINOMA EXCISION     Lip    Social History   Occupational History   Not on file  Tobacco Use   Smoking status: Every Day    Packs/day: 0.50    Types: Cigarettes   Smokeless tobacco: Never  Vaping Use   Vaping Use: Never used  Substance and Sexual Activity   Alcohol use: No   Drug use: No   Sexual activity: Yes

## 2021-02-05 ENCOUNTER — Telehealth: Payer: Self-pay | Admitting: Orthopaedic Surgery

## 2021-02-05 ENCOUNTER — Other Ambulatory Visit: Payer: Self-pay

## 2021-02-05 DIAGNOSIS — M25632 Stiffness of left wrist, not elsewhere classified: Secondary | ICD-10-CM | POA: Diagnosis not present

## 2021-02-05 DIAGNOSIS — M6281 Muscle weakness (generalized): Secondary | ICD-10-CM | POA: Diagnosis not present

## 2021-02-05 DIAGNOSIS — S52502S Unspecified fracture of the lower end of left radius, sequela: Secondary | ICD-10-CM | POA: Diagnosis not present

## 2021-02-05 DIAGNOSIS — M25532 Pain in left wrist: Secondary | ICD-10-CM | POA: Diagnosis not present

## 2021-02-05 DIAGNOSIS — S52532A Colles' fracture of left radius, initial encounter for closed fracture: Secondary | ICD-10-CM

## 2021-02-05 DIAGNOSIS — W19XXXS Unspecified fall, sequela: Secondary | ICD-10-CM | POA: Diagnosis not present

## 2021-02-05 DIAGNOSIS — M25542 Pain in joints of left hand: Secondary | ICD-10-CM | POA: Diagnosis not present

## 2021-02-05 NOTE — Telephone Encounter (Signed)
FAXED

## 2021-02-05 NOTE — Telephone Encounter (Signed)
Cassie from [employer] called. She would like a referral for OT for the patient. Her call back number is [phone]

## 2021-02-05 NOTE — Telephone Encounter (Signed)
[facility] is requesting OT orders to be faxed to below listed number. Patient is currently being treated for Closed Colles' fracture of left radius  [phone]

## 2021-02-20 ENCOUNTER — Ambulatory Visit: Payer: Medicare Other | Admitting: Orthopaedic Surgery

## 2021-03-09 DIAGNOSIS — H353132 Nonexudative age-related macular degeneration, bilateral, intermediate dry stage: Secondary | ICD-10-CM | POA: Diagnosis not present

## 2021-03-09 DIAGNOSIS — Z961 Presence of intraocular lens: Secondary | ICD-10-CM | POA: Diagnosis not present

## 2021-03-09 DIAGNOSIS — H04213 Epiphora due to excess lacrimation, bilateral lacrimal glands: Secondary | ICD-10-CM | POA: Diagnosis not present

## 2021-03-09 DIAGNOSIS — H11823 Conjunctivochalasis, bilateral: Secondary | ICD-10-CM | POA: Diagnosis not present

## 2021-03-21 DIAGNOSIS — Z23 Encounter for immunization: Secondary | ICD-10-CM | POA: Diagnosis not present

## 2021-03-27 DIAGNOSIS — Z23 Encounter for immunization: Secondary | ICD-10-CM | POA: Diagnosis not present

## 2021-04-23 DIAGNOSIS — N390 Urinary tract infection, site not specified: Secondary | ICD-10-CM | POA: Diagnosis not present

## 2021-08-15 DIAGNOSIS — Z20822 Contact with and (suspected) exposure to covid-19: Secondary | ICD-10-CM | POA: Diagnosis not present

## 2021-09-11 DIAGNOSIS — D23122 Other benign neoplasm of skin of left lower eyelid, including canthus: Secondary | ICD-10-CM | POA: Diagnosis not present

## 2021-09-11 DIAGNOSIS — Z961 Presence of intraocular lens: Secondary | ICD-10-CM | POA: Diagnosis not present

## 2021-09-11 DIAGNOSIS — H353132 Nonexudative age-related macular degeneration, bilateral, intermediate dry stage: Secondary | ICD-10-CM | POA: Diagnosis not present

## 2021-09-11 DIAGNOSIS — H04123 Dry eye syndrome of bilateral lacrimal glands: Secondary | ICD-10-CM | POA: Diagnosis not present

## 2021-09-11 DIAGNOSIS — H04213 Epiphora due to excess lacrimation, bilateral lacrimal glands: Secondary | ICD-10-CM | POA: Diagnosis not present

## 2021-10-02 DIAGNOSIS — I10 Essential (primary) hypertension: Secondary | ICD-10-CM | POA: Diagnosis not present

## 2021-10-02 DIAGNOSIS — E782 Mixed hyperlipidemia: Secondary | ICD-10-CM | POA: Diagnosis not present

## 2021-10-02 DIAGNOSIS — E559 Vitamin D deficiency, unspecified: Secondary | ICD-10-CM | POA: Diagnosis not present

## 2021-10-09 DIAGNOSIS — R011 Cardiac murmur, unspecified: Secondary | ICD-10-CM | POA: Diagnosis not present

## 2021-10-09 DIAGNOSIS — R82998 Other abnormal findings in urine: Secondary | ICD-10-CM | POA: Diagnosis not present

## 2021-10-09 DIAGNOSIS — Z0000 Encounter for general adult medical examination without abnormal findings: Secondary | ICD-10-CM | POA: Diagnosis not present

## 2021-10-09 DIAGNOSIS — D751 Secondary polycythemia: Secondary | ICD-10-CM | POA: Diagnosis not present

## 2021-10-09 DIAGNOSIS — J439 Emphysema, unspecified: Secondary | ICD-10-CM | POA: Diagnosis not present

## 2021-10-09 DIAGNOSIS — R2681 Unsteadiness on feet: Secondary | ICD-10-CM | POA: Diagnosis not present

## 2021-10-09 DIAGNOSIS — I10 Essential (primary) hypertension: Secondary | ICD-10-CM | POA: Diagnosis not present

## 2021-10-09 DIAGNOSIS — E782 Mixed hyperlipidemia: Secondary | ICD-10-CM | POA: Diagnosis not present

## 2021-10-09 DIAGNOSIS — F17200 Nicotine dependence, unspecified, uncomplicated: Secondary | ICD-10-CM | POA: Diagnosis not present

## 2021-10-09 DIAGNOSIS — R634 Abnormal weight loss: Secondary | ICD-10-CM | POA: Diagnosis not present

## 2021-10-09 DIAGNOSIS — Z1339 Encounter for screening examination for other mental health and behavioral disorders: Secondary | ICD-10-CM | POA: Diagnosis not present

## 2021-10-09 DIAGNOSIS — M810 Age-related osteoporosis without current pathological fracture: Secondary | ICD-10-CM | POA: Diagnosis not present

## 2021-10-09 DIAGNOSIS — Z1331 Encounter for screening for depression: Secondary | ICD-10-CM | POA: Diagnosis not present

## 2021-10-15 DIAGNOSIS — Z20822 Contact with and (suspected) exposure to covid-19: Secondary | ICD-10-CM | POA: Diagnosis not present

## 2021-10-25 DIAGNOSIS — Z20822 Contact with and (suspected) exposure to covid-19: Secondary | ICD-10-CM | POA: Diagnosis not present

## 2021-10-26 DIAGNOSIS — Z20822 Contact with and (suspected) exposure to covid-19: Secondary | ICD-10-CM | POA: Diagnosis not present

## 2021-11-14 ENCOUNTER — Emergency Department (HOSPITAL_COMMUNITY)
Admission: EM | Admit: 2021-11-14 | Discharge: 2021-11-15 | Disposition: A | Discharge status: Home / Self Care | Payer: Medicare Other | Attending: Emergency Medicine | Admitting: Emergency Medicine

## 2021-11-14 ENCOUNTER — Other Ambulatory Visit: Payer: Self-pay

## 2021-11-14 DIAGNOSIS — M549 Dorsalgia, unspecified: Secondary | ICD-10-CM | POA: Diagnosis not present

## 2021-11-14 DIAGNOSIS — W19XXXA Unspecified fall, initial encounter: Secondary | ICD-10-CM | POA: Insufficient documentation

## 2021-11-14 DIAGNOSIS — I10 Essential (primary) hypertension: Secondary | ICD-10-CM | POA: Insufficient documentation

## 2021-11-14 DIAGNOSIS — D72829 Elevated white blood cell count, unspecified: Secondary | ICD-10-CM | POA: Diagnosis not present

## 2021-11-14 DIAGNOSIS — S299XXA Unspecified injury of thorax, initial encounter: Secondary | ICD-10-CM | POA: Diagnosis present

## 2021-11-14 DIAGNOSIS — S22081A Stable burst fracture of T11-T12 vertebra, initial encounter for closed fracture: Secondary | ICD-10-CM | POA: Diagnosis not present

## 2021-11-14 DIAGNOSIS — M545 Low back pain, unspecified: Secondary | ICD-10-CM | POA: Diagnosis not present

## 2021-11-14 DIAGNOSIS — S0990XA Unspecified injury of head, initial encounter: Secondary | ICD-10-CM | POA: Insufficient documentation

## 2021-11-14 DIAGNOSIS — R110 Nausea: Secondary | ICD-10-CM | POA: Diagnosis not present

## 2021-11-14 DIAGNOSIS — S22080A Wedge compression fracture of T11-T12 vertebra, initial encounter for closed fracture: Secondary | ICD-10-CM | POA: Diagnosis not present

## 2021-11-14 DIAGNOSIS — Y92009 Unspecified place in unspecified non-institutional (private) residence as the place of occurrence of the external cause: Secondary | ICD-10-CM | POA: Diagnosis not present

## 2021-11-14 DIAGNOSIS — M4316 Spondylolisthesis, lumbar region: Secondary | ICD-10-CM | POA: Diagnosis not present

## 2021-11-14 NOTE — ED Provider Notes (Signed)
[facility] EMERGENCY DEPARTMENT Provider Note   CSN: [id number] Arrival date & time: 11/14/21  2203     History  Chief Complaint  Patient presents with   Fall   Back Pain    Alyssa Howard is a 81 y.o. female. With past medical history of hyperlipidemia, hypertension, tobacco use, emphysema who presents to the emergency department with fall.  States this evening she was at home when she fell. States she was going to spray ants at her door. States she bent down to spray them and "the next thing I remember was I had fallen backward on the floor." She has some gaps within this time. She is unsure if she hit her head. She states that since then she has had low back pain and nausea. Denies ever having palpitations, chest pain, shortness of breath or dizziness before or after the event. She has had multiple falls before. States she uses a cane and walker at home. Was using a cane at the time. Not anticoagulated.     Fall  Back Pain     Home Medications Prior to Admission medications   Medication Sig Start Date End Date Taking? Authorizing Provider  cholecalciferol (VITAMIN D) 1000 units tablet Take 1,000 Units by mouth daily.    [provider]  HYDROcodone-acetaminophen (NORCO/VICODIN) 5-325 MG tablet Take 1 tablet by mouth every 4 (four) hours as needed. 10/30/20   Maudie Flakes, MD      Allergies    Penicillins and Zetia [ezetimibe]    Review of Systems   Review of Systems  Gastrointestinal:  Positive for nausea.  Musculoskeletal:  Positive for back pain.  All other systems reviewed and are negative.  Physical Exam Updated Vital Signs BP (!) 147/84 (BP Location: Left Arm)   Pulse 77   Temp 98 F (36.7 C) (Oral)   Resp 18   SpO2 96%  Physical Exam Vitals and nursing note reviewed.  Constitutional:      General: She is not in acute distress.    Appearance: Normal appearance. She is normal weight. She is not ill-appearing.  HENT:      Head: Normocephalic and atraumatic.     Nose: Nose normal.     Mouth/Throat:     Mouth: Mucous membranes are dry.     Pharynx: Oropharynx is clear.  Eyes:     General: No scleral icterus.    Extraocular Movements: Extraocular movements intact.     Pupils: Pupils are equal, round, and reactive to light.  Cardiovascular:     Rate and Rhythm: Normal rate and regular rhythm.     Pulses: Normal pulses.     Heart sounds: No murmur heard. Pulmonary:     Effort: Pulmonary effort is normal. No respiratory distress.     Breath sounds: Normal breath sounds.  Abdominal:     General: Bowel sounds are normal. There is no distension.     Palpations: Abdomen is soft.     Tenderness: There is no abdominal tenderness.  Musculoskeletal:        General: Normal range of motion.     Cervical back: Normal range of motion and neck supple. No rigidity or tenderness.     Lumbar back: Tenderness and bony tenderness present.  Skin:    General: Skin is warm and dry.     Capillary Refill: Capillary refill takes less than 2 seconds.     Findings: No bruising.  Neurological:     General: No  focal deficit present.     Mental Status: She is alert and oriented to person, place, and time. Mental status is at baseline.  Psychiatric:        Mood and Affect: Mood normal.        Behavior: Behavior normal.        Thought Content: Thought content normal.        Judgment: Judgment normal.    ED Results / Procedures / Treatments   Labs (all labs ordered are listed, but only abnormal results are displayed) Labs Reviewed  COMPREHENSIVE METABOLIC PANEL  CBC WITH DIFFERENTIAL/PLATELET  URINALYSIS, ROUTINE W REFLEX MICROSCOPIC  CBG MONITORING, ED  TROPONIN I (HIGH SENSITIVITY)   EKG None  Radiology No results found.  Procedures Procedures   Medications Ordered in ED Medications - No data to display  ED Course/ Medical Decision Making/ A&P                           Medical Decision Making Amount and/or  Complexity of Data Reviewed Labs: ordered. Radiology: ordered.  This patient presents to the ED for concern of fall, this involves an extensive number of treatment options, and is a complaint that carries with it a high risk of complications and morbidity.  The differential diagnosis includes intracranial hemorrhage, cervical/thoracic/lumbar spinal injury, extremity injury, blunt trauma to the thorax or abdomen  Co morbidities that complicate the patient evaluation Osteopenia, HTN, tobacco use  Additional history obtained:  Additional history obtained from: EMS External records from outside source obtained and reviewed including: ED physician note, orthopedic physician note  EKG: EKG: {ekg findings:315101}.   Cardiac Monitoring: The patient was maintained on a cardiac monitor.  I personally viewed and interpreted the cardiac monitored which showed an underlying rhythm of: ***  Lab Results: I personally ordered, reviewed, and interpreted labs. Pertinent results include:   Imaging Studies ordered:  I ordered imaging studies which included CT.  I independently reviewed & interpreted imaging & am in agreement with radiology impression. Imaging shows:  Medications  I ordered medication including *** for *** Reevaluation of the patient after medication shows that patient {resolved/improved/worsened:23923::"improved"} -I reviewed the patient's home medications and {Desc; did/not:14019} make adjustments. -I {Desc; did/not:3044021} prescribe new home medications.  Tests Considered: ***  Critical Interventions: ***  Consultations: I requested consultation with the ***,  and discussed lab and imaging findings as well as pertinent plan - they recommend: ***  SDH (Homeless, nursing home, dementia, medication noncompliance, substance abuse, transportation issues, lack of primary care  (How did you deal with this - talk to [facility], pharmacy to dispense, communication with nursing home,  etc.)  ED Course:   2230: offered Zofran but patient declined.   After consideration of the diagnostic results and the patients response to treatment, I feel that the patent would benefit from ***. The patient has been appropriately medically screened and/or stabilized in the ED. I have low suspicion for any other emergent medical condition which would require further screening, evaluation or treatment in the ED or require inpatient management. ***The patient is overall well appearing and non-toxic in appearance. They are hemodynamically stable at time of discharge.   Final Clinical Impression(s) / ED Diagnoses Final diagnoses:  None    Rx / DC Orders ED Discharge Orders     None

## 2021-11-14 NOTE — ED Triage Notes (Signed)
Pt arrives via [facility] from home for fall. Pt was bending over and lost balance and fell backwards, landing on back. Pt denies hitting head. Pt c/o R side upper back pain. Pt states she falls often, uses walker or cane. 134/80, HR 85, 98% RA, CBG 208

## 2021-11-15 ENCOUNTER — Emergency Department (HOSPITAL_COMMUNITY): Payer: Medicare Other

## 2021-11-15 ENCOUNTER — Encounter (HOSPITAL_COMMUNITY): Payer: Self-pay

## 2021-11-15 DIAGNOSIS — S0990XA Unspecified injury of head, initial encounter: Secondary | ICD-10-CM | POA: Diagnosis not present

## 2021-11-15 DIAGNOSIS — M4316 Spondylolisthesis, lumbar region: Secondary | ICD-10-CM | POA: Diagnosis not present

## 2021-11-15 DIAGNOSIS — S22081A Stable burst fracture of T11-T12 vertebra, initial encounter for closed fracture: Secondary | ICD-10-CM | POA: Diagnosis not present

## 2021-11-15 DIAGNOSIS — M545 Low back pain, unspecified: Secondary | ICD-10-CM | POA: Diagnosis not present

## 2021-11-15 LAB — CBC WITH DIFFERENTIAL/PLATELET
Abs Immature Granulocytes: 0.08 10*3/uL — ABNORMAL HIGH (ref 0.00–0.07)
Basophils Absolute: 0 10*3/uL (ref 0.0–0.1)
Basophils Relative: 0 %
Eosinophils Absolute: 0 10*3/uL (ref 0.0–0.5)
Eosinophils Relative: 0 %
HCT: 50.2 % — ABNORMAL HIGH (ref 36.0–46.0)
Hemoglobin: 16.2 g/dL — ABNORMAL HIGH (ref 12.0–15.0)
Immature Granulocytes: 1 %
Lymphocytes Relative: 6 %
Lymphs Abs: 1.1 10*3/uL (ref 0.7–4.0)
MCH: 31.2 pg (ref 26.0–34.0)
MCHC: 32.3 g/dL (ref 30.0–36.0)
MCV: 96.7 fL (ref 80.0–100.0)
Monocytes Absolute: 0.8 10*3/uL (ref 0.1–1.0)
Monocytes Relative: 5 %
Neutro Abs: 15.4 10*3/uL — ABNORMAL HIGH (ref 1.7–7.7)
Neutrophils Relative %: 88 %
Platelets: 294 10*3/uL (ref 150–400)
RBC: 5.19 MIL/uL — ABNORMAL HIGH (ref 3.87–5.11)
RDW: 12.5 % (ref 11.5–15.5)
WBC: 17.4 10*3/uL — ABNORMAL HIGH (ref 4.0–10.5)
nRBC: 0 % (ref 0.0–0.2)

## 2021-11-15 LAB — URINALYSIS, ROUTINE W REFLEX MICROSCOPIC
Bilirubin Urine: NEGATIVE
Glucose, UA: NEGATIVE mg/dL
Ketones, ur: NEGATIVE mg/dL
Nitrite: NEGATIVE
Protein, ur: NEGATIVE mg/dL
Specific Gravity, Urine: 1.017 (ref 1.005–1.030)
pH: 5 (ref 5.0–8.0)

## 2021-11-15 LAB — COMPREHENSIVE METABOLIC PANEL
ALT: 18 U/L (ref 0–44)
AST: 28 U/L (ref 15–41)
Albumin: 4.1 g/dL (ref 3.5–5.0)
Alkaline Phosphatase: 78 U/L (ref 38–126)
Anion gap: 9 (ref 5–15)
BUN: 16 mg/dL (ref 8–23)
CO2: 24 mmol/L (ref 22–32)
Calcium: 9.9 mg/dL (ref 8.9–10.3)
Chloride: 103 mmol/L (ref 98–111)
Creatinine, Ser: 0.75 mg/dL (ref 0.44–1.00)
GFR, Estimated: >60 mL/min (ref >60)
Glucose, Bld: 109 mg/dL — ABNORMAL HIGH (ref 70–99)
Potassium: 4.6 mmol/L (ref 3.5–5.1)
Sodium: 136 mmol/L (ref 135–145)
Total Bilirubin: 0.7 mg/dL (ref 0.3–1.2)
Total Protein: 6.9 g/dL (ref 6.5–8.1)

## 2021-11-15 LAB — TROPONIN I (HIGH SENSITIVITY)
Troponin I (High Sensitivity): 6 ng/L (ref <18)
Troponin I (High Sensitivity): 8 ng/L (ref <18)

## 2021-11-15 MED ORDER — ONDANSETRON 4 MG PO TBDP
4.0000 mg | ORAL_TABLET | Freq: Once | ORAL | Status: AC
  Administered 2021-11-15: 4 mg via ORAL
  Filled 2021-11-15: qty 1

## 2021-11-15 MED ORDER — IOHEXOL 350 MG/ML SOLN: 75.0000 mL | Freq: Once | INTRAVENOUS | Status: DC | PRN

## 2021-11-15 NOTE — Discharge Instructions (Signed)
You were seen in the emergency department today for a fall. You did fracture a vertebrae in your back. We placed you in a brace for comfort. You can take this off to shower and sleep. Please use when up during the day. Please return to the emergency department for worsening symptoms including numbness or tingling in your legs, weakness to your legs, numbness to your groin, inability to control your bowel or bladder.. Please use your cane or walker as needed to help you move about. I have referred you to a neurosurgeon. They should call in the next 48 hours to schedule a follow-up appointment with you.

## 2021-11-15 NOTE — ED Notes (Signed)
This RN assisted patient to bathroom at this time. Clean catch urine sample obtained and sent to the lab via tube station.

## 2021-11-15 NOTE — Progress Notes (Signed)
Orthopedic Tech Progress Note Patient Details:  Alyssa Howard Feb 21, 1941 [id number]  Patient ID: Alyssa Howard, female   DOB: 07-04-1940, 81 y.o.   MRN: [id number] I applied a lso brace. Karolee Stamps 11/15/2021, 5:39 AM

## 2021-11-15 NOTE — ED Notes (Signed)
Ortho called to place TLSO brace.

## 2021-12-05 NOTE — Patient Outreach (Addendum)
Received a referral notification for Ms. Alyssa Howard ---. I have assigned Valente David, RN to call for follow up and determine if there are any Case Management needs.    [facility], [place], [place] Management [phone]

## 2021-12-06 ENCOUNTER — Other Ambulatory Visit: Payer: Self-pay | Admitting: *Deleted

## 2021-12-06 NOTE — Patient Outreach (Signed)
[facility]) Care Management  12/06/2021  Alyssa Howard 03/02/1941 [id number]   Referral Date: 6/14 Referral Source: Insurance Referral Reason: Chronic Disease Management (HTN and COPD) Insurance: Medicare   Outreach attempt #1, unsuccessful, unable to leave voice message.  Plan: RN CM will follow up within the next 2-3 business days.  Valente David, RN, MSN, [place] Manager [phone]

## 2021-12-07 DIAGNOSIS — S22080D Wedge compression fracture of T11-T12 vertebra, subsequent encounter for fracture with routine healing: Secondary | ICD-10-CM | POA: Diagnosis not present

## 2021-12-10 ENCOUNTER — Other Ambulatory Visit: Payer: Self-pay | Admitting: *Deleted

## 2021-12-10 NOTE — Patient Outreach (Signed)
[place] [facility]) Care Management  12/10/2021  WYNEE MATARAZZO 09-Dec-1940 [id number]   Referral Date: 6/14 Referral Source: Insurance Referral Reason: Chronic Disease Management (HTN and COPD) Insurance: Medicare     Outreach attempt #2, unsuccessful, unable to leave voice message.  Same number is listed for husband.  Plan: RN CM will send outreach letter and follow up within the next 5-7 business days.  Valente David, RN, MSN, [place] Manager [phone]

## 2021-12-11 ENCOUNTER — Other Ambulatory Visit: Payer: Self-pay | Admitting: *Deleted

## 2021-12-11 DIAGNOSIS — S22000D Wedge compression fracture of unspecified thoracic vertebra, subsequent encounter for fracture with routine healing: Secondary | ICD-10-CM | POA: Diagnosis not present

## 2021-12-11 DIAGNOSIS — H409 Unspecified glaucoma: Secondary | ICD-10-CM | POA: Diagnosis not present

## 2021-12-11 DIAGNOSIS — J439 Emphysema, unspecified: Secondary | ICD-10-CM | POA: Diagnosis not present

## 2021-12-11 DIAGNOSIS — E782 Mixed hyperlipidemia: Secondary | ICD-10-CM | POA: Diagnosis not present

## 2021-12-11 DIAGNOSIS — G629 Polyneuropathy, unspecified: Secondary | ICD-10-CM | POA: Diagnosis not present

## 2021-12-11 DIAGNOSIS — I10 Essential (primary) hypertension: Secondary | ICD-10-CM | POA: Diagnosis not present

## 2021-12-11 DIAGNOSIS — L89322 Pressure ulcer of left buttock, stage 2: Secondary | ICD-10-CM | POA: Diagnosis not present

## 2021-12-11 DIAGNOSIS — L89100 Pressure ulcer of unspecified part of back, unstageable: Secondary | ICD-10-CM | POA: Diagnosis not present

## 2021-12-11 DIAGNOSIS — G4730 Sleep apnea, unspecified: Secondary | ICD-10-CM | POA: Diagnosis not present

## 2021-12-11 NOTE — Patient Outreach (Signed)
[facility]) Care Management  12/11/2021  NAKEYA ADINOLFI 05-03-1941 [id number]   Incoming call received back from husband after missed calls.  Identity verified.  This care manager introduced self and stated purpose of call.  [facility] care management services explained.  Denies needing additional assistance at this time, state home health has been ordered after member suffered a fall a couple weeks ago. Difference between home health and [facility] explained, he again feels everything will be covered with home health.  He does agree to follow up call within the next 2 weeks to confirm member is still doing well.  If no further needs at that time, will close case.  Valente David, RN, MSN, [place] Manager [phone]

## 2021-12-19 ENCOUNTER — Other Ambulatory Visit: Payer: Self-pay | Admitting: *Deleted

## 2021-12-19 NOTE — Patient Outreach (Signed)
[facility]) Care Management  12/19/2021  Alyssa Howard 1940-07-20 [id number]   Member's husband declined services, will close case.  Will reopen of member or husband call back with interest in participation.  Valente David, RN, MSN, [place] Manager [phone]

## 2021-12-21 ENCOUNTER — Ambulatory Visit: Payer: Self-pay | Admitting: *Deleted

## 2021-12-28 ENCOUNTER — Ambulatory Visit: Payer: Self-pay | Admitting: *Deleted

## 2022-01-22 DIAGNOSIS — S22080D Wedge compression fracture of T11-T12 vertebra, subsequent encounter for fracture with routine healing: Secondary | ICD-10-CM | POA: Diagnosis not present

## 2022-01-22 DIAGNOSIS — S22080G Wedge compression fracture of T11-T12 vertebra, subsequent encounter for fracture with delayed healing: Secondary | ICD-10-CM | POA: Diagnosis not present

## 2022-03-24 ENCOUNTER — Encounter (HOSPITAL_BASED_OUTPATIENT_CLINIC_OR_DEPARTMENT_OTHER): Payer: Self-pay

## 2022-03-24 ENCOUNTER — Emergency Department (HOSPITAL_BASED_OUTPATIENT_CLINIC_OR_DEPARTMENT_OTHER): Payer: Medicare Other | Admitting: Radiology

## 2022-03-24 ENCOUNTER — Inpatient Hospital Stay (HOSPITAL_BASED_OUTPATIENT_CLINIC_OR_DEPARTMENT_OTHER)
Admission: EM | Admit: 2022-03-24 | Discharge: 2022-03-27 | DRG: 145 | Disposition: A | Discharge status: Home Health | Payer: Medicare Other | Attending: Internal Medicine | Admitting: Internal Medicine

## 2022-03-24 ENCOUNTER — Other Ambulatory Visit: Payer: Self-pay

## 2022-03-24 DIAGNOSIS — F17200 Nicotine dependence, unspecified, uncomplicated: Secondary | ICD-10-CM | POA: Diagnosis not present

## 2022-03-24 DIAGNOSIS — Z8249 Family history of ischemic heart disease and other diseases of the circulatory system: Secondary | ICD-10-CM

## 2022-03-24 DIAGNOSIS — Z888 Allergy status to other drugs, medicaments and biological substances status: Secondary | ICD-10-CM

## 2022-03-24 DIAGNOSIS — Z880 Allergy status to penicillin: Secondary | ICD-10-CM

## 2022-03-24 DIAGNOSIS — F17210 Nicotine dependence, cigarettes, uncomplicated: Secondary | ICD-10-CM | POA: Diagnosis present

## 2022-03-24 DIAGNOSIS — Z85828 Personal history of other malignant neoplasm of skin: Secondary | ICD-10-CM | POA: Diagnosis not present

## 2022-03-24 DIAGNOSIS — Z79899 Other long term (current) drug therapy: Secondary | ICD-10-CM | POA: Diagnosis not present

## 2022-03-24 DIAGNOSIS — J449 Chronic obstructive pulmonary disease, unspecified: Secondary | ICD-10-CM | POA: Diagnosis not present

## 2022-03-24 DIAGNOSIS — E7800 Pure hypercholesterolemia, unspecified: Secondary | ICD-10-CM | POA: Diagnosis not present

## 2022-03-24 DIAGNOSIS — J439 Emphysema, unspecified: Secondary | ICD-10-CM | POA: Diagnosis present

## 2022-03-24 DIAGNOSIS — I10 Essential (primary) hypertension: Secondary | ICD-10-CM | POA: Diagnosis not present

## 2022-03-24 DIAGNOSIS — Z1152 Encounter for screening for COVID-19: Secondary | ICD-10-CM

## 2022-03-24 DIAGNOSIS — J390 Retropharyngeal and parapharyngeal abscess: Secondary | ICD-10-CM | POA: Diagnosis not present

## 2022-03-24 DIAGNOSIS — J029 Acute pharyngitis, unspecified: Secondary | ICD-10-CM | POA: Diagnosis not present

## 2022-03-24 DIAGNOSIS — R070 Pain in throat: Secondary | ICD-10-CM

## 2022-03-24 DIAGNOSIS — L0291 Cutaneous abscess, unspecified: Secondary | ICD-10-CM | POA: Diagnosis not present

## 2022-03-24 DIAGNOSIS — J36 Peritonsillar abscess: Secondary | ICD-10-CM | POA: Diagnosis present

## 2022-03-24 DIAGNOSIS — J9811 Atelectasis: Secondary | ICD-10-CM | POA: Diagnosis not present

## 2022-03-24 DIAGNOSIS — R69 Illness, unspecified: Secondary | ICD-10-CM | POA: Diagnosis not present

## 2022-03-24 DIAGNOSIS — E860 Dehydration: Secondary | ICD-10-CM

## 2022-03-24 LAB — RESP PANEL BY RT-PCR (FLU A&B, COVID) ARPGX2
Influenza A by PCR: NEGATIVE
Influenza B by PCR: NEGATIVE
SARS Coronavirus 2 by RT PCR: NEGATIVE

## 2022-03-24 LAB — GROUP A STREP BY PCR: Group A Strep by PCR: NOT DETECTED

## 2022-03-24 MED ORDER — DEXAMETHASONE SODIUM PHOSPHATE 10 MG/ML IJ SOLN
10.0000 mg | Freq: Once | INTRAMUSCULAR | Status: AC
  Administered 2022-03-25: 10 mg via INTRAVENOUS
  Filled 2022-03-24: qty 1

## 2022-03-24 MED ORDER — FENTANYL CITRATE PF 50 MCG/ML IJ SOSY
50.0000 ug | PREFILLED_SYRINGE | Freq: Once | INTRAMUSCULAR | Status: AC
  Administered 2022-03-25: 50 ug via INTRAVENOUS
  Filled 2022-03-24: qty 1

## 2022-03-24 MED ORDER — SODIUM CHLORIDE 0.9 % IV BOLUS
1000.0000 mL | Freq: Once | INTRAVENOUS | Status: AC
Start: 2022-03-25 — End: 2022-03-25
  Administered 2022-03-25: 1000 mL via INTRAVENOUS

## 2022-03-24 NOTE — ED Triage Notes (Addendum)
Patient BIB [facility] from Home.  Endorses Main Complaint of Sore Throat that began 3-5 Days ago. Worsened Since. Decreased PO Intake due to Same. Associated with Productive Cough.  No Chills or Known Fevers. No Aches. Irritation and Redness noted upon Examination. No Obvious Oral or Airway Swelling at this Time.   NAD Noted during Triage. A&O4. GCS 15. BIB Stretcher and Transferred into Wheelchair. VSS with [facility]. CBG 160 as well.

## 2022-03-25 ENCOUNTER — Inpatient Hospital Stay (HOSPITAL_COMMUNITY): Payer: Medicare Other | Admitting: Anesthesiology

## 2022-03-25 ENCOUNTER — Emergency Department (HOSPITAL_BASED_OUTPATIENT_CLINIC_OR_DEPARTMENT_OTHER): Payer: Medicare Other

## 2022-03-25 ENCOUNTER — Other Ambulatory Visit: Payer: Self-pay

## 2022-03-25 ENCOUNTER — Encounter (HOSPITAL_COMMUNITY)
Admission: EM | Disposition: A | Discharge status: Home Health | Payer: Self-pay | Source: Home / Self Care | Attending: Internal Medicine

## 2022-03-25 ENCOUNTER — Encounter (HOSPITAL_BASED_OUTPATIENT_CLINIC_OR_DEPARTMENT_OTHER): Payer: Self-pay | Admitting: Radiology

## 2022-03-25 ENCOUNTER — Encounter (HOSPITAL_COMMUNITY): Payer: Self-pay

## 2022-03-25 DIAGNOSIS — F17210 Nicotine dependence, cigarettes, uncomplicated: Secondary | ICD-10-CM | POA: Diagnosis present

## 2022-03-25 DIAGNOSIS — Z79899 Other long term (current) drug therapy: Secondary | ICD-10-CM | POA: Diagnosis not present

## 2022-03-25 DIAGNOSIS — J390 Retropharyngeal and parapharyngeal abscess: Secondary | ICD-10-CM | POA: Diagnosis present

## 2022-03-25 DIAGNOSIS — I10 Essential (primary) hypertension: Secondary | ICD-10-CM | POA: Diagnosis present

## 2022-03-25 DIAGNOSIS — L0291 Cutaneous abscess, unspecified: Secondary | ICD-10-CM

## 2022-03-25 DIAGNOSIS — Z85828 Personal history of other malignant neoplasm of skin: Secondary | ICD-10-CM | POA: Diagnosis not present

## 2022-03-25 DIAGNOSIS — J36 Peritonsillar abscess: Secondary | ICD-10-CM | POA: Diagnosis present

## 2022-03-25 DIAGNOSIS — E7800 Pure hypercholesterolemia, unspecified: Secondary | ICD-10-CM | POA: Diagnosis present

## 2022-03-25 DIAGNOSIS — Z888 Allergy status to other drugs, medicaments and biological substances status: Secondary | ICD-10-CM | POA: Diagnosis not present

## 2022-03-25 DIAGNOSIS — J449 Chronic obstructive pulmonary disease, unspecified: Secondary | ICD-10-CM

## 2022-03-25 DIAGNOSIS — J029 Acute pharyngitis, unspecified: Secondary | ICD-10-CM | POA: Diagnosis not present

## 2022-03-25 DIAGNOSIS — J439 Emphysema, unspecified: Secondary | ICD-10-CM | POA: Diagnosis present

## 2022-03-25 DIAGNOSIS — R070 Pain in throat: Secondary | ICD-10-CM

## 2022-03-25 DIAGNOSIS — Z1152 Encounter for screening for COVID-19: Secondary | ICD-10-CM | POA: Diagnosis not present

## 2022-03-25 DIAGNOSIS — Z8249 Family history of ischemic heart disease and other diseases of the circulatory system: Secondary | ICD-10-CM | POA: Diagnosis not present

## 2022-03-25 DIAGNOSIS — F17200 Nicotine dependence, unspecified, uncomplicated: Secondary | ICD-10-CM | POA: Diagnosis not present

## 2022-03-25 DIAGNOSIS — Z880 Allergy status to penicillin: Secondary | ICD-10-CM | POA: Diagnosis not present

## 2022-03-25 HISTORY — PX: INCISION AND DRAINAGE OF PERITONSILLAR ABCESS: SHX6257

## 2022-03-25 LAB — CBC WITH DIFFERENTIAL/PLATELET
Abs Immature Granulocytes: 0.05 10*3/uL (ref 0.00–0.07)
Basophils Absolute: 0 10*3/uL (ref 0.0–0.1)
Basophils Relative: 0 %
Eosinophils Absolute: 0 10*3/uL (ref 0.0–0.5)
Eosinophils Relative: 0 %
HCT: 44.4 % (ref 36.0–46.0)
Hemoglobin: 15.4 g/dL — ABNORMAL HIGH (ref 12.0–15.0)
Immature Granulocytes: 0 %
Lymphocytes Relative: 11 %
Lymphs Abs: 1.5 10*3/uL (ref 0.7–4.0)
MCH: 32.6 pg (ref 26.0–34.0)
MCHC: 34.7 g/dL (ref 30.0–36.0)
MCV: 94.1 fL (ref 80.0–100.0)
Monocytes Absolute: 1.2 10*3/uL — ABNORMAL HIGH (ref 0.1–1.0)
Monocytes Relative: 9 %
Neutro Abs: 11 10*3/uL — ABNORMAL HIGH (ref 1.7–7.7)
Neutrophils Relative %: 80 %
Platelets: 303 10*3/uL (ref 150–400)
RBC: 4.72 MIL/uL (ref 3.87–5.11)
RDW: 12.3 % (ref 11.5–15.5)
WBC: 13.8 10*3/uL — ABNORMAL HIGH (ref 4.0–10.5)
nRBC: 0 % (ref 0.0–0.2)

## 2022-03-25 LAB — BASIC METABOLIC PANEL
Anion gap: 11 (ref 5–15)
BUN: 24 mg/dL — ABNORMAL HIGH (ref 8–23)
CO2: 27 mmol/L (ref 22–32)
Calcium: 9.9 mg/dL (ref 8.9–10.3)
Chloride: 102 mmol/L (ref 98–111)
Creatinine, Ser: 0.71 mg/dL (ref 0.44–1.00)
GFR, Estimated: >60 mL/min (ref >60)
Glucose, Bld: 110 mg/dL — ABNORMAL HIGH (ref 70–99)
Potassium: 3.4 mmol/L — ABNORMAL LOW (ref 3.5–5.1)
Sodium: 140 mmol/L (ref 135–145)

## 2022-03-25 SURGERY — INCISION AND DRAINAGE, ABSCESS, PERITONSILLAR
Anesthesia: General | Laterality: Right

## 2022-03-25 MED ORDER — ACETAMINOPHEN 650 MG RE SUPP
650.0000 mg | Freq: Four times a day (QID) | RECTAL | Status: DC | PRN
Start: 2022-03-25 — End: 2022-03-27

## 2022-03-25 MED ORDER — ESMOLOL HCL 100 MG/10ML IV SOLN
INTRAVENOUS | Status: AC
  Filled 2022-03-25: qty 10

## 2022-03-25 MED ORDER — DEXAMETHASONE SODIUM PHOSPHATE 10 MG/ML IJ SOLN
INTRAMUSCULAR | Status: DC | PRN
  Administered 2022-03-25: 10 mg via INTRAVENOUS

## 2022-03-25 MED ORDER — FENTANYL CITRATE PF 50 MCG/ML IJ SOSY
50.0000 ug | PREFILLED_SYRINGE | Freq: Once | INTRAMUSCULAR | Status: AC
  Administered 2022-03-25: 50 ug via INTRAVENOUS
  Filled 2022-03-25: qty 1

## 2022-03-25 MED ORDER — ONDANSETRON HCL 4 MG/2ML IJ SOLN: 4.0000 mg | Freq: Once | INTRAMUSCULAR | Status: DC | PRN

## 2022-03-25 MED ORDER — CLINDAMYCIN PHOSPHATE 600 MG/50ML IV SOLN
600.0000 mg | Freq: Three times a day (TID) | INTRAVENOUS | Status: DC
  Administered 2022-03-25 – 2022-03-27 (×6): 600 mg via INTRAVENOUS
  Filled 2022-03-25 (×10): qty 50

## 2022-03-25 MED ORDER — ESMOLOL HCL 100 MG/10ML IV SOLN
INTRAVENOUS | Status: DC | PRN
  Administered 2022-03-25 (×2): 20 mg via INTRAVENOUS

## 2022-03-25 MED ORDER — LIDOCAINE-EPINEPHRINE 1 %-1:100000 IJ SOLN
INTRAMUSCULAR | Status: AC
  Filled 2022-03-25: qty 1

## 2022-03-25 MED ORDER — LIDOCAINE-EPINEPHRINE 1 %-1:100000 IJ SOLN
INTRAMUSCULAR | Status: DC | PRN
  Administered 2022-03-25: 4 mL

## 2022-03-25 MED ORDER — PROPOFOL 10 MG/ML IV BOLUS
INTRAVENOUS | Status: AC
  Filled 2022-03-25: qty 20

## 2022-03-25 MED ORDER — CLINDAMYCIN PHOSPHATE 600 MG/50ML IV SOLN
600.0000 mg | Freq: Once | INTRAVENOUS | Status: AC
  Administered 2022-03-25: 600 mg via INTRAVENOUS
  Filled 2022-03-25: qty 50

## 2022-03-25 MED ORDER — SODIUM CHLORIDE 0.9 % IV SOLN: Freq: Once | INTRAVENOUS | Status: AC

## 2022-03-25 MED ORDER — ONDANSETRON HCL 4 MG/2ML IJ SOLN
4.0000 mg | Freq: Four times a day (QID) | INTRAMUSCULAR | Status: DC | PRN
Start: 2022-03-25 — End: 2022-03-27

## 2022-03-25 MED ORDER — PROPOFOL 10 MG/ML IV BOLUS
INTRAVENOUS | Status: DC | PRN
  Administered 2022-03-25: 100 mg via INTRAVENOUS

## 2022-03-25 MED ORDER — PHENOL 1.4 % MT LIQD
1.0000 | OROMUCOSAL | Status: DC | PRN
  Administered 2022-03-26: 1 via OROMUCOSAL
  Filled 2022-03-25 (×2): qty 177

## 2022-03-25 MED ORDER — OXYMETAZOLINE HCL 0.05 % NA SOLN
NASAL | Status: DC | PRN
  Administered 2022-03-25: 1 via TOPICAL

## 2022-03-25 MED ORDER — FENTANYL CITRATE (PF) 100 MCG/2ML IJ SOLN
25.0000 ug | INTRAMUSCULAR | Status: DC | PRN
  Administered 2022-03-26: 25 ug via INTRAVENOUS

## 2022-03-25 MED ORDER — IOHEXOL 300 MG/ML  SOLN
100.0000 mL | Freq: Once | INTRAMUSCULAR | Status: AC | PRN
  Administered 2022-03-25: 60 mL via INTRAVENOUS

## 2022-03-25 MED ORDER — ACETAMINOPHEN 10 MG/ML IV SOLN
1000.0000 mg | Freq: Once | INTRAVENOUS | Status: DC | PRN
  Administered 2022-03-26: 1000 mg via INTRAVENOUS

## 2022-03-25 MED ORDER — LIDOCAINE HCL (CARDIAC) PF 100 MG/5ML IV SOSY
PREFILLED_SYRINGE | INTRAVENOUS | Status: DC | PRN
  Administered 2022-03-25: 60 mg via INTRATRACHEAL

## 2022-03-25 MED ORDER — ONDANSETRON HCL 4 MG PO TABS: 4.0000 mg | ORAL_TABLET | Freq: Four times a day (QID) | ORAL | Status: DC | PRN

## 2022-03-25 MED ORDER — AMISULPRIDE (ANTIEMETIC) 5 MG/2ML IV SOLN
10.0000 mg | Freq: Once | INTRAVENOUS | Status: AC | PRN
  Administered 2022-03-26: 10 mg via INTRAVENOUS

## 2022-03-25 MED ORDER — FENTANYL CITRATE (PF) 250 MCG/5ML IJ SOLN
INTRAMUSCULAR | Status: AC
  Filled 2022-03-25: qty 5

## 2022-03-25 MED ORDER — 0.9 % SODIUM CHLORIDE (POUR BTL) OPTIME
TOPICAL | Status: DC | PRN
  Administered 2022-03-25: 500 mL

## 2022-03-25 MED ORDER — ACETAMINOPHEN 325 MG PO TABS: 650.0000 mg | ORAL_TABLET | Freq: Four times a day (QID) | ORAL | Status: DC | PRN

## 2022-03-25 MED ORDER — LACTATED RINGERS IV SOLN: INTRAVENOUS | Status: DC | PRN

## 2022-03-25 MED ORDER — SUCCINYLCHOLINE CHLORIDE 200 MG/10ML IV SOSY
PREFILLED_SYRINGE | INTRAVENOUS | Status: DC | PRN
  Administered 2022-03-25: 60 mg via INTRAVENOUS

## 2022-03-25 MED ORDER — FENTANYL CITRATE (PF) 250 MCG/5ML IJ SOLN
INTRAMUSCULAR | Status: DC | PRN
  Administered 2022-03-25: 100 ug via INTRAVENOUS

## 2022-03-25 MED ORDER — ONDANSETRON HCL 4 MG/2ML IJ SOLN
INTRAMUSCULAR | Status: DC | PRN
  Administered 2022-03-25: 4 mg via INTRAVENOUS

## 2022-03-25 MED ORDER — OXYMETAZOLINE HCL 0.05 % NA SOLN
NASAL | Status: AC
  Filled 2022-03-25: qty 30

## 2022-03-25 SURGICAL SUPPLY — 25 items
BAG COUNTER SPONGE SURGICOUNT (BAG) ×1 IMPLANT
BAG SPNG CNTER NS LX DISP (BAG) ×1
CATH ROBINSON RED A/P 12FR (CATHETERS) IMPLANT
CLEANER TIP ELECTROSURG 2X2 (MISCELLANEOUS) ×1 IMPLANT
COAGULATOR SUCT SWTCH 10FR 6 (ELECTROSURGICAL) ×1 IMPLANT
DRAPE HALF SHEET 40X57 (DRAPES) IMPLANT
ELECT COATED BLADE 2.86 ST (ELECTRODE) ×1 IMPLANT
ELECT REM PT RETURN 9FT ADLT (ELECTROSURGICAL) ×1
ELECTRODE REM PT RTRN 9FT ADLT (ELECTROSURGICAL) IMPLANT
GLOVE BIO SURGEON STRL SZ7 (GLOVE) ×1 IMPLANT
GOWN STRL REUS W/ TWL LRG LVL3 (GOWN DISPOSABLE) ×2 IMPLANT
GOWN STRL REUS W/TWL LRG LVL3 (GOWN DISPOSABLE) ×2
KIT BASIN OR (CUSTOM PROCEDURE TRAY) ×1 IMPLANT
KIT TURNOVER KIT B (KITS) ×1 IMPLANT
NDL HYPO 25GX1X1/2 BEV (NEEDLE) IMPLANT
NEEDLE HYPO 25GX1X1/2 BEV (NEEDLE) IMPLANT
NS IRRIG 1000ML POUR BTL (IV SOLUTION) ×1 IMPLANT
PAD ARMBOARD 7.5X6 YLW CONV (MISCELLANEOUS) ×2 IMPLANT
PENCIL SMOKE EVACUATOR (MISCELLANEOUS) ×1 IMPLANT
SPECIMEN JAR SMALL (MISCELLANEOUS) ×2 IMPLANT
SPONGE TONSIL TAPE 1 RFD (DISPOSABLE) ×1 IMPLANT
TOWEL GREEN STERILE FF (TOWEL DISPOSABLE) ×1 IMPLANT
TRAY ENT MC OR (CUSTOM PROCEDURE TRAY) ×1 IMPLANT
TUBE SALEM SUMP 14F W/ARV (TUBING) ×1 IMPLANT
TUBE SALEM SUMP 16 FR W/ARV (TUBING) ×1 IMPLANT

## 2022-03-25 NOTE — ED Notes (Signed)
[facility] call for transport @ 4:46 pm

## 2022-03-25 NOTE — Anesthesia Preprocedure Evaluation (Addendum)
Anesthesia Evaluation  Patient identified by MRN, date of birth, ID band Patient awake    Reviewed: Allergy & Precautions, NPO status , Patient's Chart, lab work & pertinent test results  Airway Mallampati: III  TM Distance: >3 FB Neck ROM: Full    Dental no notable dental hx.    Pulmonary COPD, Current Smoker and Patient abstained from smoking.,    Pulmonary exam normal        Cardiovascular hypertension, Normal cardiovascular exam     Neuro/Psych negative neurological ROS  negative psych ROS   GI/Hepatic negative GI ROS, Neg liver ROS,   Endo/Other  negative endocrine ROS  Renal/GU negative Renal ROS     Musculoskeletal negative musculoskeletal ROS (+)   Abdominal   Peds  Hematology negative hematology ROS (+)   Anesthesia Other Findings abscess  Reproductive/Obstetrics                            Anesthesia Physical Anesthesia Plan  ASA: 3 and emergent  Anesthesia Plan: General   Post-op Pain Management:    Induction: Intravenous and Rapid sequence  PONV Risk Score and Plan: 2 and Ondansetron, Dexamethasone and Treatment may vary due to age or medical condition  Airway Management Planned: Oral ETT and Video Laryngoscope Planned  Additional Equipment:   Intra-op Plan:   Post-operative Plan: Extubation in OR  Informed Consent: I have reviewed the patients History and Physical, chart, labs and discussed the procedure including the risks, benefits and alternatives for the proposed anesthesia with the patient or authorized representative who has indicated his/her understanding and acceptance.     Dental advisory given  Plan Discussed with: CRNA  Anesthesia Plan Comments:        Anesthesia Quick Evaluation

## 2022-03-25 NOTE — Progress Notes (Signed)
Pt admitted to the unit at this time. She is alert and oriented x4. Bed in lowest position, with personal items and call bell in reach. [facility] MD have been paged by Ledora Bottcher.

## 2022-03-25 NOTE — Consult Note (Signed)
Reason for Consult: Throat infection Referring Physician: Hospitalist Dr. Elray Mcgregor Alyssa Howard is an 82 y.o. female.  HPI: This patient is an 81 year old female with a 5-day history of worsening right-sided sore throat and aggressive problems swallowing.  She has been on antibiotics and steroids and his worsened over the past 12 to 24 hours.  She was initially seen in outside ED and an ENT consulted via phone who recommended transfer to [facility].  It evidently took 18 hours for her to get here.  I was unaware of this patient until about 10 minutes before my arrival an hour or so ago at the bedside.  Patient denies any chest pain although she is somewhat disabled and has lower extremity weakness of unknown etiology.  She is on no blood thinners and only takes vitamin D.  Past Medical History:  Diagnosis Date   Dry eyes    Emphysema lung (HCC)    Hypercholesterolemia    Hypertension    Neck pain    Osteopenia    Osteopenia    Syncope and collapse    Vitamin D deficiency     Past Surgical History:  Procedure Laterality Date   CATARACT EXTRACTION     CATARACT EXTRACTION     SQUAMOUS CELL CARCINOMA EXCISION     Lip    Family History  Problem Relation Age of Onset   Diabetes Mother    Stroke Mother    Melanoma Sister    Heart disease Brother    Alzheimer's disease Brother     Social History:  reports that she has been smoking cigarettes. She has been smoking an average of .5 packs per day. She has never used smokeless tobacco. She reports that she does not drink alcohol and does not use drugs.  Allergies:  Allergies  Allergen Reactions   Penicillins    Zetia [Ezetimibe]     Medications: I have reviewed the patient's current medications.  Results for orders placed or performed during the hospital encounter of 03/24/22 (from the past 48 hour(s))  Group A Strep by PCR     Status: None   Collection Time: 03/24/22  6:47 PM   Specimen: Anterior Nasal Swab; Sterile Swab  Result  Value Ref Range   Group A Strep by PCR NOT DETECTED NOT DETECTED    Comment: Performed at [facility], [address] [place], [place] [address]  Resp Panel by RT-PCR (Flu A&B, Covid) Anterior Nasal Swab     Status: None   Collection Time: 03/24/22  6:47 PM   Specimen: Anterior Nasal Swab  Result Value Ref Range   SARS Coronavirus 2 by RT PCR NEGATIVE NEGATIVE    Comment: (NOTE) SARS-CoV-2 target nucleic acids are NOT DETECTED.  The SARS-CoV-2 RNA is generally detectable in upper respiratory specimens during the acute phase of infection. The lowest concentration of SARS-CoV-2 viral copies this assay can detect is 138 copies/mL. A negative result does not preclude SARS-Cov-2 infection and should not be used as the sole basis for treatment or other patient management decisions. A negative result may occur with  improper specimen collection/handling, submission of specimen other than nasopharyngeal swab, presence of viral mutation(s) within the areas targeted by this assay, and inadequate number of viral copies(<138 copies/mL). A negative result must be combined with clinical observations, patient history, and epidemiological information. The expected result is Negative.  Fact Sheet for Patients:  EntrepreneurPulse.com.au  Fact Sheet for Healthcare Providers:  IncredibleEmployment.be  This test is no t  yet approved or cleared by the [place] and  has been authorized for detection and/or diagnosis of SARS-CoV-2 by FDA under an Emergency Use Authorization (EUA). This EUA will remain  in effect (meaning this test can be used) for the duration of the COVID-19 declaration under Section 564(b)(1) of the Act, 21 U.S.C.section 360bbb-3(b)(1), unless the authorization is terminated  or revoked sooner.       Influenza A by PCR NEGATIVE NEGATIVE   Influenza B by PCR NEGATIVE NEGATIVE    Comment: (NOTE) The Xpert Xpress  SARS-CoV-2/FLU/RSV plus assay is intended as an aid in the diagnosis of influenza from Nasopharyngeal swab specimens and should not be used as a sole basis for treatment. Nasal washings and aspirates are unacceptable for Xpert Xpress SARS-CoV-2/FLU/RSV testing.  Fact Sheet for Patients: EntrepreneurPulse.com.au  Fact Sheet for Healthcare Providers: IncredibleEmployment.be  This test is not yet approved or cleared by the [place] FDA and has been authorized for detection and/or diagnosis of SARS-CoV-2 by FDA under an Emergency Use Authorization (EUA). This EUA will remain in effect (meaning this test can be used) for the duration of the COVID-19 declaration under Section 564(b)(1) of the Act, 21 U.S.C. section 360bbb-3(b)(1), unless the authorization is terminated or revoked.  Performed at [facility], [address], [place], [place] [address]   CBC with Differential     Status: Abnormal   Collection Time: 03/25/22 12:02 AM  Result Value Ref Range   WBC 13.8 (H) 4.0 - 10.5 K/uL   RBC 4.72 3.87 - 5.11 MIL/uL   Hemoglobin 15.4 (H) 12.0 - 15.0 g/dL   HCT 44.4 36.0 - 46.0 %   MCV 94.1 80.0 - 100.0 fL   MCH 32.6 26.0 - 34.0 pg   MCHC 34.7 30.0 - 36.0 g/dL   RDW 12.3 11.5 - 15.5 %   Platelets 303 150 - 400 K/uL   nRBC 0.0 0.0 - 0.2 %   Neutrophils Relative % 80 %   Neutro Abs 11.0 (H) 1.7 - 7.7 K/uL   Lymphocytes Relative 11 %   Lymphs Abs 1.5 0.7 - 4.0 K/uL   Monocytes Relative 9 %   Monocytes Absolute 1.2 (H) 0.1 - 1.0 K/uL   Eosinophils Relative 0 %   Eosinophils Absolute 0.0 0.0 - 0.5 K/uL   Basophils Relative 0 %   Basophils Absolute 0.0 0.0 - 0.1 K/uL   Immature Granulocytes 0 %   Abs Immature Granulocytes 0.05 0.00 - 0.07 K/uL    Comment: Performed at [facility], [address], [place], [place] [address]  Basic metabolic panel     Status: Abnormal   Collection Time: 03/25/22 12:02 AM   Result Value Ref Range   Sodium 140 135 - 145 mmol/L   Potassium 3.4 (L) 3.5 - 5.1 mmol/L   Chloride 102 98 - 111 mmol/L   CO2 27 22 - 32 mmol/L   Glucose, Bld 110 (H) 70 - 99 mg/dL    Comment: Glucose reference range applies only to samples taken after fasting for at least 8 hours.   BUN 24 (H) 8 - 23 mg/dL   Creatinine, Ser 0.71 0.44 - 1.00 mg/dL   Calcium 9.9 8.9 - 10.3 mg/dL   GFR, Estimated >60 >60 mL/min    Comment: (NOTE) Calculated using the CKD-EPI Creatinine Equation (2021)    Anion gap 11 5 - 15    Comment: Performed at [facility], [address] [place],  [address]  CT Soft Tissue Neck W Contrast  Result Date: 03/25/2022 CLINICAL DATA:  Sore throat the began 3-5 days ago, decreased p.o. intake EXAM: CT NECK WITH CONTRAST TECHNIQUE: Multidetector CT imaging of the neck was performed using the standard protocol following the bolus administration of intravenous contrast. RADIATION DOSE REDUCTION: This exam was performed according to the departmental dose-optimization program which includes automated exposure control, adjustment of the mA and/or kV according to patient size and/or use of iterative reconstruction technique. CONTRAST:  4m OMNIPAQUE IOHEXOL 300 MG/ML  SOLN COMPARISON:  None Available. FINDINGS: Evaluation is somewhat limited by beam hardening artifact from the patient's dental hardware pharynx and larynx: Enlargement of the inferior aspect of the right palatine tonsil with a low-density collection with peripheral enhancement that measures 1.2 x 3.7 x 2.5 cm (AP x TR x CC) (series 2, image 56 and series 5, image 86), concerning for abscess. This may extend medially into the retropharyngeal space. Salivary glands: Mild atrophy of the parotid glands. The submandibular glands are unremarkable. No inflammation, mass, or stone. Thyroid: Normal. Lymph nodes: No definite lymphadenopathy, although evaluation is somewhat limited by beam hardening  artifact from the patient's dental hardware. Vascular: Patent vasculature. Limited intracranial: No acute finding. Visualized orbits: Status post bilateral lens replacements. No acute finding. Mastoids and visualized paranasal sinuses: Clear. Skeleton: Degenerative changes in the cervical spine. No acute osseous abnormality. Upper chest: Emphysema. No focal pulmonary opacity or pleural effusion. Aortic atherosclerosis. Other: None. IMPRESSION: Enlargement of the inferior aspect of the right palatine tonsil, with a low-density collection with peripheral enhancement that measures 1.2 x 3.7 x 2.5 cm, most likely an abscess. This may extend medially into the retropharyngeal space. Electronically Signed   By: AMerilyn BabaM.D.   On: 03/25/2022 01:05   DG Chest 2 View  Result Date: 03/24/2022 CLINICAL DATA:  Sore throat x3 days. EXAM: CHEST - 2 VIEW COMPARISON:  Nov 09, 2016 FINDINGS: The heart size and mediastinal contours are within normal limits. There is moderate severity calcification of the aortic arch and tortuosity of the descending thoracic aorta. The lungs are hyperinflated. Emphysematous lung disease is noted with mild linear atelectasis is seen within the lateral aspect of the right lung base. There is no evidence of a pleural effusion or pneumothorax. A chronic compression fracture deformity is seen within the lower thoracic spine. This represents a new finding when compared to the prior study. IMPRESSION: COPD with mild right basilar linear atelectasis. Electronically Signed   By: TVirgina NorfolkM.D.   On: 03/24/2022 21:56    Review of Systems Blood pressure (!) 142/74, pulse 80, temperature 97.7 F (36.5 C), temperature source Oral, resp. rate 13, height '5\' 6"'$  (1.676 m), weight 52 kg, SpO2 96 %.  Physical Exam Patient has peritonsillar swelling on the right side with uvular deviation to the left.  Her airway is not obstructed He has normal tongue mobility and no floor of mouth  edema. Salivary glands normal.  No cervical fluctuance or mass noted Cranial nerves II through XII intact with notable hearing loss Face atraumatic without bony step-offs or sinus tenderness Nasal bones normal without trauma/nasal septum midline Pinna normal without mastoid tenderness Chest symmetric expansions bilaterally without use of accessory muscles  CT scan - I reviewed the CT scan report as well as the films both an axial, coronal, and sagittal sections.  Patient has a fairly large right peritonsillar abscess which extends inferior to the tonsil and is beginning to wrap around into the retropharyngeal  space.  There is no frank pus in the retropharyngeal space at this time as best I can tell.  Her airway column is patent.  The abscess was measured to be 1.8 x 3.2 cm at its maximum dimension.  Assessment/Plan:  Right peritonsillar/parapharyngeal/early retropharyngeal abscess  I discussed the options for treatment with the patient.  Her son and husband were also in the room.  We discussed the risks of general anesthesia which I do think are necessary given the extent of the abscess.  Her risk of anesthesia is increased because of her advanced age; however, given the progression of her pain and difficulty swallowing, the risk of not operating and draining the abscess is far greater than observation with IV antibiotics and steroids.  While that was my opinion, I did give them the option of continued medical therapy.  After discussion with her family and thoroughly understanding the risks both with the procedure and general anesthesia, Ms. Dykman would like to move forward with the proposed surgery this evening.  She has been n.p.o. for the entire day.  I have spoken with the internal medicine attending myself and she is medically maximized for general anesthesia.  Boyce Medici. 03/25/2022, 9:20 PM

## 2022-03-25 NOTE — Anesthesia Procedure Notes (Signed)
Procedure Name: Intubation Date/Time: 03/25/2022 11:44 PM  Performed by: Clovis Cao, CRNAPre-anesthesia Checklist: Patient identified, Emergency Drugs available, Suction available and Patient being monitored Patient Re-evaluated:Patient Re-evaluated prior to induction Oxygen Delivery Method: Circle system utilized Preoxygenation: Pre-oxygenation with 100% oxygen Induction Type: IV induction Ventilation: Mask ventilation without difficulty Laryngoscope Size: Glidescope and 4 Grade View: Grade I Tube type: Oral Number of attempts: 1 Airway Equipment and Method: Stylet and Oral airway Placement Confirmation: ETT inserted through vocal cords under direct vision, positive ETCO2 and breath sounds checked- equal and bilateral Secured at: 21 cm Tube secured with: Tape Dental Injury: Teeth and Oropharynx as per pre-operative assessment

## 2022-03-25 NOTE — Transfer of Care (Signed)
Immediate Anesthesia Transfer of Care Note  Patient: SHAKEMIA MADERA  Procedure(s) Performed: INCISION AND DRAINAGE OF PERITONSILLAR ABCESS (Right)  Patient Location: PACU  Anesthesia Type:General  Level of Consciousness: drowsy  Airway & Oxygen Therapy: Patient Spontanous Breathing and Patient connected to face mask oxygen  Post-op Assessment: Report given to RN and Post -op Vital signs reviewed and stable  Post vital signs: Reviewed and stable  Last Vitals:  Vitals Value Taken Time  BP    Temp    Pulse 101 03/25/22 2358  Resp 12 03/25/22 2358  SpO2 100 % 03/25/22 2358  Vitals shown include unvalidated device data.  Last Pain:  Vitals:   03/25/22 2008  TempSrc: Oral  PainSc:          Complications: No notable events documented.

## 2022-03-25 NOTE — Assessment & Plan Note (Addendum)
CT soft tissue neck shows enlargement of inferior aspect of the right palatine tonsil with 1.2 x 3.7 x 2.5 cm abscess.  Suggestion of extension medially into the retropharyngeal space.  Currently controlling secretions and maintaining airway. -Discussed with on-call ENT, Dr. Marcelline Deist, appreciate assistance -Plan is for drainage tonight in OR as there are no procedure rooms available in any of the units or ER -Patient has been n.p.o. and not on anticoagulation -Continue IV clindamycin

## 2022-03-25 NOTE — H&P (Signed)
History and Physical    Alyssa Howard:[id number] DOB: 17-Aug-1940 DOA: 03/24/2022  PCP: Donnajean Lopes, MD  Patient coming from: Home  I have personally briefly reviewed patient's old medical records in [facility]  Chief Complaint: Sore throat, pain with eating  HPI: Alyssa Howard is a 81 y.o. female with no significant medical history who presented to the ED for evaluation of painful sore throat.  Patient has about 5 days of sore throat which has been progressively worsening.  Over the last 48 hours pain has been significant enough that she has not been able to eat or drink.  Pain is mostly in the right tonsillar region.  She is having frequent oral secretions but is able to control them and is maintaining her airway.  She does not have any dyspnea, chest pain, subjective fevers, chills, diaphoresis.  Patient seen on the floor on transfer from [facility] after over 24-hour hold.  Still having some pain but overall feels little bit improved.  Husband and son are at bedside.  We discussed potential need for drainage based on ENT evaluation and they are willing to proceed if necessary.  Of note, patient has decreased mobility since she suffered a T12 burst fracture in May 2023.  She has been ambulatory with a rollator but family feels she may be requiring wheelchair soon.  She does not take any medications regularly other than vitamin D supplement.  [facility] ED Course  Labs/Imaging on admission: I have personally reviewed following labs and imaging studies.  Initially presented to [facility] ED 10/1 PM.  Vitals showed BP 109/89, pulse 114, RR 16, temp 98.2 F, SPO2 96% on room air.  Labs showed WBC 13.8, hemoglobin 15.4, platelets 303,000, sodium 140, potassium 3.4, bicarb 27, BUN 24, creatinine 0.71, serum glucose 110.  Group A strep not detected.  SARS-CoV-2 and influenza PCR negative.  CT soft tissue neck with contrast showed  enlargement of inferior aspect of the right palatine tonsil with low-density collection with peripheral enhancement that measures 1.2 x 3.7 x 2.5 cm, most likely an abscess.  May be extending medially into the retropharyngeal space.  EDP discussed with on-call ENT, Dr. Redmond Baseman, who recommended starting IV clindamycin and admit to [facility].  Patient was also given 1 L normal saline and IV Decadron 10 mg.  The hospitalist service was consulted to admit for further evaluation and management.  Review of Systems: All systems reviewed and are negative except as documented in history of present illness above.   Past Medical History:  Diagnosis Date   Dry eyes    Emphysema lung (HCC)    Hypercholesterolemia    Hypertension    Neck pain    Osteopenia    Osteopenia    Syncope and collapse    Vitamin D deficiency     Past Surgical History:  Procedure Laterality Date   CATARACT EXTRACTION     CATARACT EXTRACTION     SQUAMOUS CELL CARCINOMA EXCISION     Lip    Social History:  reports that she has been smoking cigarettes. She has been smoking an average of .5 packs per day. She has never used smokeless tobacco. She reports that she does not drink alcohol and does not use drugs.  Allergies  Allergen Reactions   Penicillins    Zetia [Ezetimibe]     Family History  Problem Relation Age of Onset   Diabetes Mother    Stroke Mother  Melanoma Sister    Heart disease Brother    Alzheimer's disease Brother      Prior to Admission medications   Medication Sig Start Date End Date Taking? Authorizing Provider  cholecalciferol (VITAMIN D) 1000 units tablet Take 1,000 Units by mouth daily.    [provider]  HYDROcodone-acetaminophen (NORCO/VICODIN) 5-325 MG tablet Take 1 tablet by mouth every 4 (four) hours as needed. 10/30/20   Maudie Flakes, MD    Physical Exam: Vitals:   03/25/22 1600 03/25/22 1615 03/25/22 1831 03/25/22 1833  BP: (!) 145/97  (!) 142/74   Pulse: 80      Resp:  18  13  Temp:      TempSrc:      SpO2: 95%   96%  Weight:      Height:       Constitutional: Elderly woman sitting up in bed, NAD, calm, comfortable Eyes: EOMI, lids and conjunctivae normal ENMT: Mucous membranes are dry.  Swelling right soft palate with some erythema.  Able to open mouth, no trismus, controlling secretions.  Somewhat hard of hearing. Neck: normal, supple, no masses. Respiratory: clear to auscultation bilaterally, no wheezing, no crackles.  Cardiovascular: Regular rate and rhythm, systolic murmur present. No extremity edema. 2+ pedal pulses. Abdomen: no tenderness, no masses palpated. Musculoskeletal: no clubbing / cyanosis. No joint deformity upper and lower extremities. Good ROM, no contractures. Normal muscle tone.  Skin: no rashes, lesions, ulcers. No induration Neurologic: Sensation intact. Strength 5/5 in all 4.  Psychiatric: Alert and oriented x 3. Normal mood.   EKG: Not performed.  Assessment/Plan Principal Problem:   Peritonsillar abscess   Alyssa Howard is a 81 y.o. female with no significant medical history who is admitted with a right peritonsillar abscess with possible extension into the retropharyngeal space.  Assessment and Plan: * Peritonsillar abscess CT soft tissue neck shows enlargement of inferior aspect of the right palatine tonsil with 1.2 x 3.7 x 2.5 cm abscess.  Suggestion of extension medially into the retropharyngeal space.  Currently controlling secretions and maintaining airway. -Discussed with on-call ENT, Dr. Marcelline Deist, appreciate assistance -Plan is for drainage tonight in OR as there are no procedure rooms available in any of the units or ER -Patient has been n.p.o. and not on anticoagulation -Continue IV clindamycin  DVT prophylaxis: SCDs Start: 03/25/22 1944 Code Status: Full code, confirmed with patient on admission Family Communication: Husband and son at bedside Disposition Plan: From home, dispo pending clinical  progress Consults called: ENT Severity of Illness: The appropriate patient status for this patient is INPATIENT. Inpatient status is judged to be reasonable and necessary in order to provide the required intensity of service to ensure the patient's safety. The patient's presenting symptoms, physical exam findings, and initial radiographic and laboratory data in the context of their chronic comorbidities is felt to place them at high risk for further clinical deterioration. Furthermore, it is not anticipated that the patient will be medically stable for discharge from the hospital within 2 midnights of admission.   * I certify that at the point of admission it is my clinical judgment that the patient will require inpatient hospital care spanning beyond 2 midnights from the point of admission due to high intensity of service, high risk for further deterioration and high frequency of surveillance required.Zada Finders MD [facility]  If 7PM-7AM, please contact night-coverage [email or web address]  03/25/2022, 9:10 PM

## 2022-03-25 NOTE — Hospital Course (Signed)
Alyssa Howard is a 81 y.o. female with no significant medical history who is admitted with a right peritonsillar abscess with possible extension into the retropharyngeal space.

## 2022-03-25 NOTE — ED Provider Notes (Signed)
[facility] EMERGENCY DEPT Provider Note   CSN: [id number] Arrival date & time: 03/24/22  1914     History  Chief Complaint  Patient presents with   Sore Throat    Alyssa Howard is a 81 y.o. female.  HPI     This is an 81 year old female who presents with sore throat.  Patient reports that she has had a sore throat over the last several days.  Over the last 24 to 48 hours she states it has been so painful that she has not been able to eat or drink much.  She has not noted any fevers.  She has not taken anything for the pain.  She states that the pain extends into her neck on the right side.  No known sick contacts or COVID exposures.  Home Medications Prior to Admission medications   Medication Sig Start Date End Date Taking? Authorizing Provider  cholecalciferol (VITAMIN D) 1000 units tablet Take 1,000 Units by mouth daily.    [provider]  HYDROcodone-acetaminophen (NORCO/VICODIN) 5-325 MG tablet Take 1 tablet by mouth every 4 (four) hours as needed. 10/30/20   Maudie Flakes, MD      Allergies    Penicillins and Zetia [ezetimibe]    Review of Systems   Review of Systems  Constitutional:  Negative for fever.  HENT:  Positive for sore throat.   Respiratory:  Positive for cough.   Cardiovascular:  Negative for chest pain.  All other systems reviewed and are negative.   Physical Exam Updated Vital Signs BP 131/81   Pulse 95   Temp 98.2 F (36.8 C)   Resp 18   Ht 1.676 m ('5\' 6"'$ )   Wt 52 kg   SpO2 93%   BMI 18.50 kg/m  Physical Exam Vitals and nursing note reviewed.  Constitutional:      Appearance: She is well-developed.     Comments: Elderly, nontoxic-appearing  HENT:     Head: Normocephalic and atraumatic.     Mouth/Throat:     Mouth: Mucous membranes are dry.     Comments: Trismus noted, swelling noted of the soft palate on the right, uvula deviated right, erythema noted, no tonsillar exudate Eyes:     Pupils: Pupils are  equal, round, and reactive to light.  Cardiovascular:     Rate and Rhythm: Regular rhythm. Tachycardia present.     Heart sounds: Normal heart sounds.  Pulmonary:     Effort: Pulmonary effort is normal. No respiratory distress.     Breath sounds: No wheezing.  Abdominal:     General: Bowel sounds are normal.     Palpations: Abdomen is soft.  Musculoskeletal:     Cervical back: Neck supple.  Lymphadenopathy:     Cervical: Cervical adenopathy present.  Skin:    General: Skin is warm and dry.  Neurological:     Mental Status: She is alert and oriented to person, place, and time.  Psychiatric:        Mood and Affect: Mood normal.     ED Results / Procedures / Treatments   Labs (all labs ordered are listed, but only abnormal results are displayed) Labs Reviewed  CBC WITH DIFFERENTIAL/PLATELET - Abnormal; Notable for the following components:      Result Value   WBC 13.8 (*)    Hemoglobin 15.4 (*)    Neutro Abs 11.0 (*)    Monocytes Absolute 1.2 (*)    All other components within normal limits  BASIC  METABOLIC PANEL - Abnormal; Notable for the following components:   Potassium 3.4 (*)    Glucose, Bld 110 (*)    BUN 24 (*)    All other components within normal limits  GROUP A STREP BY PCR  RESP PANEL BY RT-PCR (FLU A&B, COVID) [id number]    EKG None  Radiology CT Soft Tissue Neck W Contrast  Result Date: 03/25/2022 CLINICAL DATA:  Sore throat the began 3-5 days ago, decreased p.o. intake EXAM: CT NECK WITH CONTRAST TECHNIQUE: Multidetector CT imaging of the neck was performed using the standard protocol following the bolus administration of intravenous contrast. RADIATION DOSE REDUCTION: This exam was performed according to the departmental dose-optimization program which includes automated exposure control, adjustment of the mA and/or kV according to patient size and/or use of iterative reconstruction technique. CONTRAST:  89m OMNIPAQUE IOHEXOL 300 MG/ML  SOLN COMPARISON:   None Available. FINDINGS: Evaluation is somewhat limited by beam hardening artifact from the patient's dental hardware pharynx and larynx: Enlargement of the inferior aspect of the right palatine tonsil with a low-density collection with peripheral enhancement that measures 1.2 x 3.7 x 2.5 cm (AP x TR x CC) (series 2, image 56 and series 5, image 86), concerning for abscess. This may extend medially into the retropharyngeal space. Salivary glands: Mild atrophy of the parotid glands. The submandibular glands are unremarkable. No inflammation, mass, or stone. Thyroid: Normal. Lymph nodes: No definite lymphadenopathy, although evaluation is somewhat limited by beam hardening artifact from the patient's dental hardware. Vascular: Patent vasculature. Limited intracranial: No acute finding. Visualized orbits: Status post bilateral lens replacements. No acute finding. Mastoids and visualized paranasal sinuses: Clear. Skeleton: Degenerative changes in the cervical spine. No acute osseous abnormality. Upper chest: Emphysema. No focal pulmonary opacity or pleural effusion. Aortic atherosclerosis. Other: None. IMPRESSION: Enlargement of the inferior aspect of the right palatine tonsil, with a low-density collection with peripheral enhancement that measures 1.2 x 3.7 x 2.5 cm, most likely an abscess. This may extend medially into the retropharyngeal space. Electronically Signed   By: AMerilyn BabaM.D.   On: 03/25/2022 01:05   DG Chest 2 View  Result Date: 03/24/2022 CLINICAL DATA:  Sore throat x3 days. EXAM: CHEST - 2 VIEW COMPARISON:  Nov 09, 2016 FINDINGS: The heart size and mediastinal contours are within normal limits. There is moderate severity calcification of the aortic arch and tortuosity of the descending thoracic aorta. The lungs are hyperinflated. Emphysematous lung disease is noted with mild linear atelectasis is seen within the lateral aspect of the right lung base. There is no evidence of a pleural effusion  or pneumothorax. A chronic compression fracture deformity is seen within the lower thoracic spine. This represents a new finding when compared to the prior study. IMPRESSION: COPD with mild right basilar linear atelectasis. Electronically Signed   By: TVirgina NorfolkM.D.   On: 03/24/2022 21:56    Procedures Procedures    Medications Ordered in ED Medications  clindamycin (CLEOCIN) IVPB 600 mg (has no administration in time range)  sodium chloride 0.9 % bolus 1,000 mL (0 mLs Intravenous Stopped 03/25/22 0126)  dexamethasone (DECADRON) injection 10 mg (10 mg Intravenous Given 03/25/22 0008)  fentaNYL (SUBLIMAZE) injection 50 mcg (50 mcg Intravenous Given 03/25/22 0008)  iohexol (OMNIPAQUE) 300 MG/ML solution 100 mL (60 mLs Intravenous Contrast Given 03/25/22 0043)    ED Course/ Medical Decision Making/ A&P Clinical Course as of 03/25/22 0146  Mon Mar 25, 2022  0133 Spoke with Dr. BRedmond Baseman  Requested that patient be given clindamycin.  Recommend she be admitted to [facility].  We will plan for admission to the hospitalist. [CH]    Clinical Course User Index [CH] Reyes Aldaco, Barbette Hair, MD                           Medical Decision Making Amount and/or Complexity of Data Reviewed Labs: ordered. Radiology: ordered.  Risk Prescription drug management. Decision regarding hospitalization.   This patient presents to the ED for concern of sore throat, this involves an extensive number of treatment options, and is a complaint that carries with it a high risk of complications and morbidity.  I considered the following differential and admission for this acute, potentially life threatening condition.  The differential diagnosis includes oral infection such as COVID or influenza, strep throat, deep space infection such as peritonsillar abscess  MDM:    This is an 81 year old female who presents with sore throat.  She is overall nontoxic-appearing but does have notable trismus.  She is tolerating her  secretions.  She is tachycardic but afebrile.  Labs reviewed.  COVID, influenza, strep testing negative.  Chest x-ray without pneumothorax or pneumonia.  I added additional lab work including CBC, BMP.  Patient was given fluids as she was noted to have ketones in her urine likely suggestive of dehydration.  She was given a dose of Decadron.  CT scan was ordered to rule out deep space infection.  CT scan indicates likely an abscess of the palatine tonsil on the right which extends medially into the retropharyngeal space.  On recheck, patient is continuing to tolerate her secretions and airway is intact.  Spoke with ENT.  See clinical course as above.  Will admit for IV fluids and antibiotics.  ENT requests phone call when patient arrives.  (Labs, imaging, consults)  Labs: I Ordered, and personally interpreted labs.  The pertinent results include: CBC, BMP, COVID, influenza, urinalysis  Imaging Studies ordered: I ordered imaging studies including CT soft tissue neck, chest x-ray I independently visualized and interpreted imaging. I agree with the radiologist interpretation  Additional history obtained from husband and son at bedside.  External records from outside source obtained and reviewed including prior evaluations  Cardiac Monitoring: The patient was maintained on a cardiac monitor.  I personally viewed and interpreted the cardiac monitored which showed an underlying rhythm of: Sinus rhythm  Reevaluation: After the interventions noted above, I reevaluated the patient and found that they have :improved  Social Determinants of Health: Lives independently  Disposition: Discharge  Co morbidities that complicate the patient evaluation  Past Medical History:  Diagnosis Date   Dry eyes    Emphysema lung ([place])    Hypercholesterolemia    Hypertension    Neck pain    Osteopenia    Osteopenia    Syncope and collapse    Vitamin D deficiency      Medicines Meds ordered this encounter   Medications   sodium chloride 0.9 % bolus 1,000 mL   dexamethasone (DECADRON) injection 10 mg   fentaNYL (SUBLIMAZE) injection 50 mcg   iohexol (OMNIPAQUE) 300 MG/ML solution 100 mL   clindamycin (CLEOCIN) IVPB 600 mg    Order Specific Question:   Antibiotic Indication:    Answer:   Other Indication (list below)    I have reviewed the patients home medicines and have made adjustments as needed  Problem List / ED Course: Problem List Items Addressed This  Visit   None Visit Diagnoses     Peritonsillar abscess    -  Primary                   Final Clinical Impression(s) / ED Diagnoses Final diagnoses:  Peritonsillar abscess    Rx / DC Orders ED Discharge Orders     None         Merryl Hacker, MD 03/25/22 0150

## 2022-03-25 NOTE — Op Note (Signed)
  Alyssa Howard female 81 y.o. 03/25/2022   Procedure: Incision and drainage of retropharyngeal/parapharyngeal abscess      Surgeon: Boyce Medici.   Assistants: None  Anesthesia: General endotracheal anesthesia   Procedure Detail  Patient was brought to the operating room and transferred to the table in supine position.  She was intubated without difficulty using the glide scope.  The head of the bed was turned.  Her eyes were taped and covered in eye pads.  A head wrap was placed.  The [place] Pascal was inserted and used to extend the tongue base anteriorly.  Examination and palpation revealed fluctuance and a tense fluid collection in the retropharyngeal space inferior and posterior to the posterior tonsillar pillar.  I injected the overlying mucosa with 1 cc of 1% lidocaine with 1 100,000 epinephrine.  After giving that time to work I made a small incision and immediately there was a large amount of pus under pressure which extruded.  Cultures were taken.  I explored the small incision in the retropharyngeal space using a curved tonsil clamp.  All pus was removed.  A second small incision was made in the right superior tonsillar pole.  This incision was connected to the posterior pillar incision.  There is a small amount of pus which was removed.  Saline was used to irrigate both incisions until clear.  Bleeding was minimal and controlled using Afrin-soaked pledgets which were removed.  All counts were correct at the end of the case.  The patient's care was turned back over to anesthesia for wake-up.  Estimated Blood Loss:  less than 50 mL               Specimens: cultures were sent        Complications:  * No complications entered in OR log *         Disposition: PACU - hemodynamically stable.         Condition: stable

## 2022-03-25 NOTE — ED Notes (Signed)
Report given to [facility]. 

## 2022-03-26 ENCOUNTER — Encounter (HOSPITAL_COMMUNITY): Payer: Self-pay | Admitting: Otolaryngology

## 2022-03-26 DIAGNOSIS — J36 Peritonsillar abscess: Secondary | ICD-10-CM | POA: Diagnosis not present

## 2022-03-26 LAB — BASIC METABOLIC PANEL
Anion gap: 10 (ref 5–15)
BUN: 27 mg/dL — ABNORMAL HIGH (ref 8–23)
CO2: 24 mmol/L (ref 22–32)
Calcium: 9 mg/dL (ref 8.9–10.3)
Chloride: 106 mmol/L (ref 98–111)
Creatinine, Ser: 0.71 mg/dL (ref 0.44–1.00)
GFR, Estimated: >60 mL/min (ref >60)
Glucose, Bld: 137 mg/dL — ABNORMAL HIGH (ref 70–99)
Potassium: 3.6 mmol/L (ref 3.5–5.1)
Sodium: 140 mmol/L (ref 135–145)

## 2022-03-26 LAB — CBC
HCT: 38.6 % (ref 36.0–46.0)
Hemoglobin: 13.5 g/dL (ref 12.0–15.0)
MCH: 33 pg (ref 26.0–34.0)
MCHC: 35 g/dL (ref 30.0–36.0)
MCV: 94.4 fL (ref 80.0–100.0)
Platelets: 282 10*3/uL (ref 150–400)
RBC: 4.09 MIL/uL (ref 3.87–5.11)
RDW: 12.3 % (ref 11.5–15.5)
WBC: 16.3 10*3/uL — ABNORMAL HIGH (ref 4.0–10.5)
nRBC: 0 % (ref 0.0–0.2)

## 2022-03-26 MED ORDER — FENTANYL CITRATE PF 50 MCG/ML IJ SOSY
25.0000 ug | PREFILLED_SYRINGE | INTRAMUSCULAR | Status: DC | PRN
  Administered 2022-03-26: 25 ug via INTRAVENOUS
  Filled 2022-03-26: qty 1

## 2022-03-26 MED ORDER — FENTANYL CITRATE PF 50 MCG/ML IJ SOSY: 25.0000 ug | PREFILLED_SYRINGE | INTRAMUSCULAR | Status: DC | PRN

## 2022-03-26 MED ORDER — ACETAMINOPHEN 10 MG/ML IV SOLN
INTRAVENOUS | Status: AC
  Filled 2022-03-26: qty 100

## 2022-03-26 MED ORDER — LABETALOL HCL 5 MG/ML IV SOLN
INTRAVENOUS | Status: AC
  Filled 2022-03-26: qty 4

## 2022-03-26 MED ORDER — FENTANYL CITRATE (PF) 100 MCG/2ML IJ SOLN
INTRAMUSCULAR | Status: AC
  Filled 2022-03-26: qty 2

## 2022-03-26 MED ORDER — LABETALOL HCL 5 MG/ML IV SOLN
5.0000 mg | INTRAVENOUS | Status: DC | PRN
  Administered 2022-03-26: 5 mg via INTRAVENOUS

## 2022-03-26 MED ORDER — OXYCODONE HCL 5 MG PO TABS: 5.0000 mg | ORAL_TABLET | Freq: Four times a day (QID) | ORAL | Status: DC | PRN

## 2022-03-26 MED ORDER — AMISULPRIDE (ANTIEMETIC) 5 MG/2ML IV SOLN
INTRAVENOUS | Status: AC
  Filled 2022-03-26: qty 4

## 2022-03-26 NOTE — Final Progress Note (Signed)
Patient is doing very well.  Just starting to eat solids this evening.  Pain controlled.  No bleeding. Breathing well.  Will sign off.  Reconsult prn.   Recommend total of 7 days of antibiotics - use cultures to guide PO choice.

## 2022-03-26 NOTE — Evaluation (Signed)
Occupational Therapy Evaluation Patient Details Name: Alyssa Howard MRN: [id number] DOB: [birth year] Today's Date: 03/26/2022   History of Present Illness 81 y.o.  female presents to ED on 10/1 with sore throat. CT soft tissue neck with contrast showed enlargement of inferior aspect of the right palatine tonsil with low-density collection with peripheral enhancement, s/p I&D on 10/2. PMH includes osteopenia, T12 compression fx x6 weeks ago from a fall per pt report, HTN, emphysema.   Clinical Impression   PTA, pt was living with her husband who was assisting with ADLs and functional transfers due to cognition and recent T11 fx after fall. Pt currently requiring Min A for UB ADLs, Max A for LB ADLs, and Mod A for bed mobility. Pt presenting with decreased strength and activity tolerance. Pt tolerating sitting at EOB for ~10 mins to perform grooming and self feeding tasks. Pt would benefit from further acute OT to facilitate safe dc. As family is prepared to provide support, recommend dc to home with [facility] for further OT to optimize safety, independence with ADLs, and return to PLOF.      Recommendations for follow up therapy are one component of a multi-disciplinary discharge planning process, led by the attending physician.  Recommendations may be updated based on patient status, additional functional criteria and insurance authorization.   Follow Up Recommendations  Home health OT    Assistance Recommended at Discharge Frequent or constant Supervision/Assistance  Patient can return home with the following      Functional Status Assessment  Patient has had a recent decline in their functional status and demonstrates the ability to make significant improvements in function in a reasonable and predictable amount of time.  Equipment Recommendations  BSC/3in1    Recommendations for Other Services       Precautions / Restrictions Precautions Precautions: Fall Restrictions Weight Bearing  Restrictions: No      Mobility Bed Mobility Overal bed mobility: Needs Assistance Bed Mobility: Supine to Sit, Sit to Supine     Supine to sit: Mod assist, HOB elevated Sit to supine: Mod assist, HOB elevated   General bed mobility comments: Mod A for managing hips and trunk. Mod A for retur=ning to supine and managing LEs    Transfers                   General transfer comment: Defered due to fatigue      Balance Overall balance assessment: Needs assistance Sitting-balance support: No upper extremity supported, Feet supported Sitting balance-Leahy Scale: Fair                                     ADL either performed or assessed with clinical judgement   ADL Overall ADL's : Needs assistance/impaired Eating/Feeding: Set up;Supervision/ safety;Sitting Eating/Feeding Details (indicate cue type and reason): pt eating pudding while seated at EOB with supervision Grooming: Set up;Wash/dry face;Supervision/safety;Sitting   Upper Body Bathing: Minimal assistance;Sitting   Lower Body Bathing: Maximal assistance;Sit to/from stand   Upper Body Dressing : Minimal assistance;Sitting   Lower Body Dressing: Maximal assistance;Sit to/from stand                 General ADL Comments: Pt fatigued and agreeable to sit at EOB for eating and wahsing her face. sitting ofr ~10 minutes.     Vision         Perception     Praxis  Pertinent Vitals/Pain Pain Assessment Pain Assessment: Faces Faces Pain Scale: No hurt Pain Intervention(s): Monitored during session     Hand Dominance Right   Extremity/Trunk Assessment Upper Extremity Assessment Upper Extremity Assessment: Generalized weakness   Lower Extremity Assessment Lower Extremity Assessment: Defer to PT evaluation   Cervical / Trunk Assessment Cervical / Trunk Assessment: Kyphotic;Other exceptions Cervical / Trunk Exceptions: scoliosis noted with mepilex over thoracic spine. prior T11  fx   Communication Communication Communication: No difficulties   Cognition Arousal/Alertness: Awake/alert Behavior During Therapy: WFL for tasks assessed/performed Overall Cognitive Status: Impaired/Different from baseline Area of Impairment: Following commands, Awareness, Problem solving                       Following Commands: Follows one step commands inconsistently   Awareness: Emergent, Intellectual   General Comments: Oriented to self, place, and time. Able to participate in conversation and discribing her dream to her son; remember each part. Follwoing simple commands     General Comments  VSS on RA. Son, Nada Boozer, present throughout    Exercises     Shoulder Instructions      Home Living Family/patient expects to be discharged to:: Private residence Living Arrangements: Spouse/significant other Available Help at Discharge: Family Type of Home: House Home Access: Stairs to enter Technical brewer of Steps: 3   Home Layout: One level     Bathroom Shower/Tub: Occupational psychologist: [place]: Rollator (4 wheels)          Prior Functioning/Environment Prior Level of Function : Needs assist             Mobility Comments: Husband assisting pt with transfers ADLs Comments: Pt's husband assisting with all ADLs since back fx        OT Problem List: Decreased strength;Decreased range of motion;Decreased activity tolerance;Impaired balance (sitting and/or standing);Decreased knowledge of use of DME or AE;Decreased knowledge of precautions      OT Treatment/Interventions: Self-care/ADL training;Therapeutic exercise;Energy conservation;DME and/or AE instruction;Therapeutic activities;Patient/family education    OT Goals(Current goals can be found in the care plan section) Acute Rehab OT Goals Patient Stated Goal: Go home OT Goal Formulation: With patient Time For Goal Achievement: 04/09/22 Potential to Achieve  Goals: Good  OT Frequency: Min 3X/week    Co-evaluation              AM-PAC OT "6 Clicks" Daily Activity     Outcome Measure Help from another person eating meals?: A Little Help from another person taking care of personal grooming?: A Little Help from another person toileting, which includes using toliet, bedpan, or urinal?: A Lot Help from another person bathing (including washing, rinsing, drying)?: A Lot Help from another person to put on and taking off regular upper body clothing?: A Little Help from another person to put on and taking off regular lower body clothing?: A Lot 6 Click Score: 15   End of Session Nurse Communication: Mobility status  Activity Tolerance: Patient limited by fatigue Patient left: in bed;with call bell/phone within reach;with family/visitor present;with nursing/sitter in room  OT Visit Diagnosis: Unsteadiness on feet (R26.81);Other abnormalities of gait and mobility (R26.89);Muscle weakness (generalized) (M62.81)                Time: 1610-9604 OT Time Calculation (min): 31 min Charges:  OT General Charges $OT Visit: 1 Visit OT Evaluation $OT Eval Moderate Complexity: 1 Mod OT Treatments $Self Care/Home Management :  8-22 mins  Khalil Belote MSOT, OTR/L Acute Rehab Office: [place] 03/26/2022, 4:18 PM

## 2022-03-26 NOTE — Progress Notes (Signed)
PROGRESS NOTE        PATIENT DETAILS Name: Alyssa Howard Age: 81 y.o. Sex: female Date of Birth: Oct 03, 1940 Admit Date: 03/24/2022 Admitting Physician Lenore Cordia, MD ZOX:[id number], Ermalene Searing, MD  Brief Summary: Patient is a 81 y.o.  female with no significant past medical history-presented with peritonsillar abscess-underwent I&D and subsequently admitted to the hospitalist service.  Significant events: 10/1>> admit-peritonsillar abscess  Significant studies: 10/2>> CT neck: Right peritonsillar abscess.  Significant microbiology data: 10/1>> COVID/influenza PCR: Negative 10/2>> abscess culture: Pending  Procedures: 10/2>> incision and drainage  Consults: ENT  Subjective: Still groggy but waking up-relatively awake/alert.  Following simple commands-some little redirection needed.  Objective: Vitals: Blood pressure 122/82, pulse 72, temperature (!) 97.5 F (36.4 C), temperature source Oral, resp. rate 17, height '5\' 6"'$  (1.676 m), weight 52 kg, SpO2 98 %.   Exam: Gen Exam:Alert awake-not in any distress HEENT:atraumatic, normocephalic Chest: B/L clear to auscultation anteriorly CVS:S1S2 regular Abdomen:soft non tender, non distended Extremities:no edema Neurology: Non focal Skin: no rash  Pertinent Labs/Radiology:    Latest Ref Rng & Units 03/26/2022    4:03 AM 03/25/2022   12:02 AM 11/14/2021   11:41 PM  CBC  WBC 4.0 - 10.5 K/uL 16.3  13.8  17.4   Hemoglobin 12.0 - 15.0 g/dL 13.5  15.4  16.2   Hematocrit 36.0 - 46.0 % 38.6  44.4  50.2   Platelets 150 - 400 K/uL 282  303  294     Lab Results  Component Value Date   NA 140 03/26/2022   K 3.6 03/26/2022   CL 106 03/26/2022   CO2 24 03/26/2022      Assessment/Plan: Peritonsillar abscess-right: S/p I&D Continue clindamycin Follow cultures Advance diet Mobilize with nursing//PT/OT  BMI: Estimated body mass index is 18.5 kg/m as calculated from the following:   Height  as of this encounter: '5\' 6"'$  (1.676 m).   Weight as of this encounter: 52 kg.   Code status:   Code Status: Full Code   DVT Prophylaxis: SCDs Start: 03/25/22 1944  Family Communication: Spouse at bedside   Disposition Plan: Status is: Inpatient Remains inpatient appropriate because: S/p I&D-on IV antibiotics-diet being advanced.   Planned Discharge Destination:Home in the next few days.   Diet: Diet Order             Diet clear liquid Room service appropriate? Yes; Fluid consistency: Thin  Diet effective now                     Antimicrobial agents: Anti-infectives (From admission, onward)    Start     Dose/Rate Route Frequency Ordered Stop   03/25/22 1600  clindamycin (CLEOCIN) IVPB 600 mg        600 mg 100 mL/hr over 30 Minutes Intravenous Every 8 hours 03/25/22 1544     03/25/22 0800  clindamycin (CLEOCIN) IVPB 600 mg        600 mg 100 mL/hr over 30 Minutes Intravenous  Once 03/25/22 0633 03/25/22 1135   03/25/22 0145  clindamycin (CLEOCIN) IVPB 600 mg        600 mg 100 mL/hr over 30 Minutes Intravenous  Once 03/25/22 0138 03/25/22 0230        MEDICATIONS: Scheduled Meds:  amisulpride       fentaNYL  labetalol       Continuous Infusions:  acetaminophen     clindamycin (CLEOCIN) IV 600 mg (03/26/22 0921)   PRN Meds:.acetaminophen, acetaminophen **OR** acetaminophen, amisulpride, fentaNYL, fentaNYL (SUBLIMAZE) injection, labetalol, ondansetron **OR** ondansetron (ZOFRAN) IV, phenol   I have personally reviewed following labs and imaging studies  LABORATORY DATA: CBC: Recent Labs  Lab 03/25/22 0002 03/26/22 0403  WBC 13.8* 16.3*  NEUTROABS 11.0*  --   HGB 15.4* 13.5  HCT 44.4 38.6  MCV 94.1 94.4  PLT 303 160    Basic Metabolic Panel: Recent Labs  Lab 03/25/22 0002 03/26/22 0403  NA 140 140  K 3.4* 3.6  CL 102 106  CO2 27 24  GLUCOSE 110* 137*  BUN 24* 27*  CREATININE 0.71 0.71  CALCIUM 9.9 9.0    GFR: Estimated  Creatinine Clearance: 45.3 mL/min (by C-G formula based on SCr of 0.71 mg/dL).  Liver Function Tests: No results for input(s): "AST", "ALT", "ALKPHOS", "BILITOT", "PROT", "ALBUMIN" in the last 168 hours. No results for input(s): "LIPASE", "AMYLASE" in the last 168 hours. No results for input(s): "AMMONIA" in the last 168 hours.  Coagulation Profile: No results for input(s): "INR", "PROTIME" in the last 168 hours.  Cardiac Enzymes: No results for input(s): "CKTOTAL", "CKMB", "CKMBINDEX", "TROPONINI" in the last 168 hours.  BNP (last 3 results) No results for input(s): "PROBNP" in the last 8760 hours.  Lipid Profile: No results for input(s): "CHOL", "HDL", "LDLCALC", "TRIG", "CHOLHDL", "LDLDIRECT" in the last 72 hours.  Thyroid Function Tests: No results for input(s): "TSH", "T4TOTAL", "FREET4", "T3FREE", "THYROIDAB" in the last 72 hours.  Anemia Panel: No results for input(s): "VITAMINB12", "FOLATE", "FERRITIN", "TIBC", "IRON", "RETICCTPCT" in the last 72 hours.  Urine analysis:    Component Value Date/Time   COLORURINE YELLOW 11/15/2021 0020   APPEARANCEUR HAZY (A) 11/15/2021 0020   LABSPEC 1.017 11/15/2021 0020   PHURINE 5.0 11/15/2021 0020   GLUCOSEU NEGATIVE 11/15/2021 0020   HGBUR SMALL (A) 11/15/2021 0020   BILIRUBINUR NEGATIVE 11/15/2021 0020   KETONESUR NEGATIVE 11/15/2021 0020   PROTEINUR NEGATIVE 11/15/2021 0020   UROBILINOGEN 0.2 08/08/2009 1229   NITRITE NEGATIVE 11/15/2021 0020   LEUKOCYTESUR TRACE (A) 11/15/2021 0020    Sepsis Labs: Lactic Acid, Venous    Component Value Date/Time   LATICACIDVEN 1.19 11/12/2016 1300    MICROBIOLOGY: Recent Results (from the past 240 hour(s))  Group A Strep by PCR     Status: None   Collection Time: 03/24/22  6:47 PM   Specimen: Anterior Nasal Swab; Sterile Swab  Result Value Ref Range Status   Group A Strep by PCR NOT DETECTED NOT DETECTED Final    Comment: Performed at [facility], [address], [place], [place] [address]  Resp Panel by RT-PCR (Flu A&B, Covid) Anterior Nasal Swab     Status: None   Collection Time: 03/24/22  6:47 PM   Specimen: Anterior Nasal Swab  Result Value Ref Range Status   SARS Coronavirus 2 by RT PCR NEGATIVE NEGATIVE Final    Comment: (NOTE) SARS-CoV-2 target nucleic acids are NOT DETECTED.  The SARS-CoV-2 RNA is generally detectable in upper respiratory specimens during the acute phase of infection. The lowest concentration of SARS-CoV-2 viral copies this assay can detect is 138 copies/mL. A negative result does not preclude SARS-Cov-2 infection and should not be used as the sole basis for treatment or other patient management decisions. A negative result may occur with  improper specimen collection/handling, submission of specimen  other than nasopharyngeal swab, presence of viral mutation(s) within the areas targeted by this assay, and inadequate number of viral copies(<138 copies/mL). A negative result must be combined with clinical observations, patient history, and epidemiological information. The expected result is Negative.  Fact Sheet for Patients:  EntrepreneurPulse.com.au  Fact Sheet for Healthcare Providers:  IncredibleEmployment.be  This test is no t yet approved or cleared by the [place] FDA and  has been authorized for detection and/or diagnosis of SARS-CoV-2 by FDA under an Emergency Use Authorization (EUA). This EUA will remain  in effect (meaning this test can be used) for the duration of the COVID-19 declaration under Section 564(b)(1) of the Act, 21 U.S.C.section 360bbb-3(b)(1), unless the authorization is terminated  or revoked sooner.       Influenza A by PCR NEGATIVE NEGATIVE Final   Influenza B by PCR NEGATIVE NEGATIVE Final    Comment: (NOTE) The Xpert Xpress SARS-CoV-2/FLU/RSV plus assay is intended as an aid in the diagnosis of influenza from Nasopharyngeal  swab specimens and should not be used as a sole basis for treatment. Nasal washings and aspirates are unacceptable for Xpert Xpress SARS-CoV-2/FLU/RSV testing.  Fact Sheet for Patients: EntrepreneurPulse.com.au  Fact Sheet for Healthcare Providers: IncredibleEmployment.be  This test is not yet approved or cleared by the [place] FDA and has been authorized for detection and/or diagnosis of SARS-CoV-2 by FDA under an Emergency Use Authorization (EUA). This EUA will remain in effect (meaning this test can be used) for the duration of the COVID-19 declaration under Section 564(b)(1) of the Act, 21 U.S.C. section 360bbb-3(b)(1), unless the authorization is terminated or revoked.  Performed at [facility], [address], [place], [place] [address]   Aerobic/Anaerobic Culture w Gram Stain (surgical/deep wound)     Status: None (Preliminary result)   Collection Time: 03/25/22 11:21 PM   Specimen: PATH Cytology Misc. fluid; Body Fluid  Result Value Ref Range Status   Specimen Description WOUND  Final   Special Requests RETROPHARYNGEAL ABSC SPEC A  Final   Gram Stain   Final    FEW WBC PRESENT, PREDOMINANTLY MONONUCLEAR MODERATE GRAM POSITIVE COCCI IN PAIRS AND CHAINS MODERATE GRAM NEGATIVE RODS FEW GRAM POSITIVE RODS Performed at [facility], [place] [address] [place], [place] [address]    Culture PENDING  Incomplete   Report Status PENDING  Incomplete    RADIOLOGY STUDIES/RESULTS: CT Soft Tissue Neck W Contrast  Result Date: 03/25/2022 CLINICAL DATA:  Sore throat the began 3-5 days ago, decreased p.o. intake EXAM: CT NECK WITH CONTRAST TECHNIQUE: Multidetector CT imaging of the neck was performed using the standard protocol following the bolus administration of intravenous contrast. RADIATION DOSE REDUCTION: This exam was performed according to the departmental dose-optimization program which includes automated  exposure control, adjustment of the mA and/or kV according to patient size and/or use of iterative reconstruction technique. CONTRAST:  65m OMNIPAQUE IOHEXOL 300 MG/ML  SOLN COMPARISON:  None Available. FINDINGS: Evaluation is somewhat limited by beam hardening artifact from the patient's dental hardware pharynx and larynx: Enlargement of the inferior aspect of the right palatine tonsil with a low-density collection with peripheral enhancement that measures 1.2 x 3.7 x 2.5 cm (AP x TR x CC) (series 2, image 56 and series 5, image 86), concerning for abscess. This may extend medially into the retropharyngeal space. Salivary glands: Mild atrophy of the parotid glands. The submandibular glands are unremarkable. No inflammation, mass, or stone. Thyroid: Normal. Lymph nodes: No definite lymphadenopathy, although  evaluation is somewhat limited by beam hardening artifact from the patient's dental hardware. Vascular: Patent vasculature. Limited intracranial: No acute finding. Visualized orbits: Status post bilateral lens replacements. No acute finding. Mastoids and visualized paranasal sinuses: Clear. Skeleton: Degenerative changes in the cervical spine. No acute osseous abnormality. Upper chest: Emphysema. No focal pulmonary opacity or pleural effusion. Aortic atherosclerosis. Other: None. IMPRESSION: Enlargement of the inferior aspect of the right palatine tonsil, with a low-density collection with peripheral enhancement that measures 1.2 x 3.7 x 2.5 cm, most likely an abscess. This may extend medially into the retropharyngeal space. Electronically Signed   By: Merilyn Baba M.D.   On: 03/25/2022 01:05   DG Chest 2 View  Result Date: 03/24/2022 CLINICAL DATA:  Sore throat x3 days. EXAM: CHEST - 2 VIEW COMPARISON:  Nov 09, 2016 FINDINGS: The heart size and mediastinal contours are within normal limits. There is moderate severity calcification of the aortic arch and tortuosity of the descending thoracic aorta. The  lungs are hyperinflated. Emphysematous lung disease is noted with mild linear atelectasis is seen within the lateral aspect of the right lung base. There is no evidence of a pleural effusion or pneumothorax. A chronic compression fracture deformity is seen within the lower thoracic spine. This represents a new finding when compared to the prior study. IMPRESSION: COPD with mild right basilar linear atelectasis. Electronically Signed   By: Virgina Norfolk M.D.   On: 03/24/2022 21:56     LOS: 1 day   Oren Binet, MD  [facility]    To contact the attending provider between 7A-7P or the covering provider during after hours 7P-7A, please log into the web site [email or web address] and access using universal [place] password for that web site. If you do not have the password, please call the hospital operator.  03/26/2022, 9:56 AM

## 2022-03-26 NOTE — Evaluation (Signed)
Physical Therapy Evaluation Patient Details Name: Alyssa Howard MRN: [id number] DOB: September 18, 1940 Today's Date: 03/26/2022  History of Present Illness  81 y.o.  female presents to ED on 10/1 with sore throat. CT soft tissue neck with contrast showed enlargement of inferior aspect of the right palatine tonsil with low-density collection with peripheral enhancement, s/p I&D on 10/2. PMH includes osteopenia, T12 compression fx x6 weeks ago from a fall per pt report, HTN, emphysema.  Clinical Impression   Pt presents with generalized weakness, impaired cognition vs baseline, poor balance with recent history of falls, impaired activity tolerance. Pt to benefit from acute PT to address deficits. Pt overall requiring light physical assist for transfer-level mobility at this time, per pt's husband pt is typically ambulatory with RW in the home and needs help for all mobility and ADLs. Pt's husband expresses pt and family would like to take pt home with HHPT post-acutely.  PT to progress mobility as tolerated, and will continue to follow acutely.         Recommendations for follow up therapy are one component of a multi-disciplinary discharge planning process, led by the attending physician.  Recommendations may be updated based on patient status, additional functional criteria and insurance authorization.  Follow Up Recommendations Home health PT      Assistance Recommended at Discharge Frequent or constant Supervision/Assistance  Patient can return home with the following  A little help with walking and/or transfers;A little help with bathing/dressing/bathroom;Assistance with cooking/housework    Equipment Recommendations None recommended by PT  Recommendations for Other Services       Functional Status Assessment Patient has had a recent decline in their functional status and demonstrates the ability to make significant improvements in function in a reasonable and predictable amount of time.      Precautions / Restrictions Precautions Precautions: Fall Restrictions Weight Bearing Restrictions: No      Mobility  Bed Mobility Overal bed mobility: Needs Assistance Bed Mobility: Supine to Sit, Sit to Supine     Supine to sit: Min assist, HOB elevated Sit to supine: Min assist, HOB elevated   General bed mobility comments: assist for trunk elevation/lowering, scootign to EOB.    Transfers Overall transfer level: Needs assistance Equipment used: Rolling walker (2 wheels) Transfers: Sit to/from Stand, Bed to chair/wheelchair/BSC Sit to Stand: Min assist   Step pivot transfers: Min assist       General transfer comment: assist for power up, steadying upon standing, and steadying during stand pivot to/from [facility].    Ambulation/Gait               General Gait Details: nt - pt fatigue  Stairs            Wheelchair Mobility    Modified Rankin (Stroke Patients Only)       Balance Overall balance assessment: Needs assistance Sitting-balance support: No upper extremity supported, Feet supported Sitting balance-Leahy Scale: Fair     Standing balance support: Bilateral upper extremity supported, During functional activity, Reliant on assistive device for balance Standing balance-Leahy Scale: Poor Standing balance comment: reliant on RW                             Pertinent Vitals/Pain Pain Assessment Pain Assessment: Faces Faces Pain Scale: No hurt Pain Intervention(s): Monitored during session    Home Living Family/patient expects to be discharged to:: Private residence Living Arrangements: Spouse/significant other Available Help at Discharge: Family  Type of Home: House Home Access: Stairs to enter   [employer] of Steps: 3   Home Layout: One level Home Equipment: Rollator (4 wheels)      Prior Function Prior Level of Function : Needs assist             Mobility Comments: husband helps pt up and down, and  reports she needs help managing RW       Hand Dominance   Dominant Hand: Right    Extremity/Trunk Assessment   Upper Extremity Assessment Upper Extremity Assessment: Defer to OT evaluation    Lower Extremity Assessment Lower Extremity Assessment: Generalized weakness    Cervical / Trunk Assessment Cervical / Trunk Assessment: Kyphotic;Other exceptions Cervical / Trunk Exceptions: scoliosis noted with mepilex over thoracic spine  Communication   Communication: No difficulties  Cognition Arousal/Alertness: Awake/alert Behavior During Therapy: WFL for tasks assessed/performed Overall Cognitive Status: Impaired/Different from baseline Area of Impairment: Orientation, Attention, Memory, Following commands, Problem solving                 Orientation Level: Disoriented to, Place, Time, Situation Current Attention Level: Focused Memory: Decreased short-term memory Following Commands: Follows one step commands with increased time     Problem Solving: Slow processing, Decreased initiation, Difficulty sequencing, Requires verbal cues, Requires tactile cues General Comments: pt oriented to self, states she is at home two different times during session. Pt sleeping upon arrival to room, per pt's husband at bedside pt has been lethargic today.        General Comments General comments (skin integrity, edema, etc.): vss, 2LO2 removed as pt satting 93-94% on RA, RN agrees    Exercises     Assessment/Plan    PT Assessment Patient needs continued PT services  PT Problem List Decreased strength;Decreased mobility;Decreased activity tolerance;Decreased balance;Decreased knowledge of use of DME;Pain;Decreased safety awareness;Decreased cognition       PT Treatment Interventions DME instruction;Therapeutic activities;Gait training;Therapeutic exercise;Patient/family education;Balance training;Stair training;Functional mobility training;Neuromuscular re-education    PT Goals  (Current goals can be found in the Care Plan section)  Acute Rehab PT Goals Patient Stated Goal: home PT Goal Formulation: With patient/family Time For Goal Achievement: 04/09/22 Potential to Achieve Goals: Good    Frequency Min 3X/week     Co-evaluation               AM-PAC PT "6 Clicks" Mobility  Outcome Measure Help needed turning from your back to your side while in a flat bed without using bedrails?: A Little Help needed moving from lying on your back to sitting on the side of a flat bed without using bedrails?: A Little Help needed moving to and from a bed to a chair (including a wheelchair)?: A Little Help needed standing up from a chair using your arms (e.g., wheelchair or bedside chair)?: A Little Help needed to walk in hospital room?: A Lot Help needed climbing 3-5 steps with a railing? : A Lot 6 Click Score: 16    End of Session   Activity Tolerance: Patient tolerated treatment well Patient left: in bed;with call bell/phone within reach;with family/visitor present;with bed alarm set Nurse Communication: Mobility status PT Visit Diagnosis: Other abnormalities of gait and mobility (R26.89);Muscle weakness (generalized) (M62.81)    Time: 1324-4010 PT Time Calculation (min) (ACUTE ONLY): 36 min   Charges:   PT Evaluation $PT Eval Low Complexity: 1 Low PT Treatments $Therapeutic Activity: 8-22 mins       Alyssa Howard S, PT DPT Acute Rehabilitation  Services Pager [phone]  Office [phone]   Louis Matte 03/26/2022, 2:59 PM

## 2022-03-26 NOTE — Addendum Note (Signed)
Addendum  created 03/26/22 1704 by Moshe Salisbury, CRNA   Intraprocedure Event edited

## 2022-03-26 NOTE — TOC Transition Note (Signed)
3 discharge meds in [place] for patient until discharge.

## 2022-03-26 NOTE — Progress Notes (Signed)
Call placed to Dr. Roanna Banning to inform of patients increased blood pressure. She has been consisently running in  the 140's over low 100's, with a heart rate in the 110's since arriving to PACU. She has had pain medication, as well as antinausea medication, she denies any pain or nausea at this time. Inquiry was made of Dr. Roanna Banning if he would like to order something for her blood pressure. New orders  were obtained and  one dose of labetalol 5 mg IV was given as ordered. She did not require any other doses while in PACU. Blood pressure and heart rate decreased and remained stable an in acceptable limits. Patient has been alert, oriented and pleasant during her time in PACU. Report was called to [place] and patient was transported to [place] by this Therapist, sports. Patient's family met [place] in the hallway to accompany patient to her room. Patient hand off occurred smoothly with no complications in patients room 5W 25 with her primary RN Consuela Mimes. RN denied any need for further assistance from myself. Patient stable and family at bedside with RN as I exited room.

## 2022-03-26 NOTE — Progress Notes (Signed)
  Transition of Care [facility]) Screening Note   Patient Details  Name: Alyssa Howard Date of Birth: May 14, 1941   [facility]) CM/SW Contact:    Cyndi Bender, RN Phone Number: 03/26/2022, 3:52 PM    Transition of Care Department [facility]) has reviewed patient and no TOC needs have been identified at this time. We will continue to monitor patient advancement through interdisciplinary progression rounds. If new patient transition needs arise, please place a TOC consult.

## 2022-03-26 NOTE — Anesthesia Postprocedure Evaluation (Signed)
Anesthesia Post Note  Patient: Alyssa Howard  Procedure(s) Performed: INCISION AND DRAINAGE OF PERITONSILLAR ABCESS (Right)     Patient location during evaluation: PACU Anesthesia Type: General Level of consciousness: awake Pain management: pain level controlled Vital Signs Assessment: post-procedure vital signs reviewed and stable Respiratory status: spontaneous breathing, nonlabored ventilation, respiratory function stable and patient connected to nasal cannula oxygen Cardiovascular status: blood pressure returned to baseline and stable Postop Assessment: no apparent nausea or vomiting Anesthetic complications: no   No notable events documented.  Last Vitals:  Vitals:   03/26/22 0200 03/26/22 0400  BP:  127/80  Pulse:  82  Resp:  16  Temp: 37 C 36.9 C  SpO2:  97%    Last Pain:  Vitals:   03/26/22 0400  TempSrc: Axillary  PainSc:                  Karyl Kinnier Brendalee Matthies

## 2022-03-26 NOTE — Plan of Care (Signed)

## 2022-03-26 NOTE — Progress Notes (Signed)
Pt arrived to the floor after surgery.Pt stable,but stated she is having pain in her throat.RN messaged dr.Gardner,MD ordered PRN IV Fentanyl.IV fentanyl 25 mg given to pt.Will reassess the pain level.

## 2022-03-26 NOTE — Plan of Care (Signed)
  Problem: Education: Goal: Knowledge of General Education information will improve Description Including pain rating scale, medication(s)/side effects and non-pharmacologic comfort measures Outcome: Progressing   

## 2022-03-27 ENCOUNTER — Other Ambulatory Visit (HOSPITAL_COMMUNITY): Payer: Self-pay

## 2022-03-27 DIAGNOSIS — J36 Peritonsillar abscess: Secondary | ICD-10-CM | POA: Diagnosis not present

## 2022-03-27 DIAGNOSIS — J390 Retropharyngeal and parapharyngeal abscess: Secondary | ICD-10-CM | POA: Diagnosis not present

## 2022-03-27 LAB — CBC
HCT: 39.3 % (ref 36.0–46.0)
Hemoglobin: 13.3 g/dL (ref 12.0–15.0)
MCH: 32.1 pg (ref 26.0–34.0)
MCHC: 33.8 g/dL (ref 30.0–36.0)
MCV: 94.9 fL (ref 80.0–100.0)
Platelets: 300 10*3/uL (ref 150–400)
RBC: 4.14 MIL/uL (ref 3.87–5.11)
RDW: 12.4 % (ref 11.5–15.5)
WBC: 13.2 10*3/uL — ABNORMAL HIGH (ref 4.0–10.5)
nRBC: 0 % (ref 0.0–0.2)

## 2022-03-27 MED ORDER — CEFDINIR 300 MG PO CAPS
300.0000 mg | ORAL_CAPSULE | Freq: Two times a day (BID) | ORAL | 0 refills | Status: DC
  Filled 2022-03-27: qty 56, 28d supply, fill #0

## 2022-03-27 MED ORDER — METRONIDAZOLE 500 MG PO TABS
500.0000 mg | ORAL_TABLET | Freq: Three times a day (TID) | ORAL | 0 refills | Status: DC
  Filled 2022-03-27: qty 84, 28d supply, fill #0

## 2022-03-27 MED ORDER — CEFPODOXIME PROXETIL 200 MG PO TABS
200.0000 mg | ORAL_TABLET | Freq: Two times a day (BID) | ORAL | 0 refills | Status: DC
  Filled 2022-03-27: qty 56, 28d supply, fill #0

## 2022-03-27 MED ORDER — METRONIDAZOLE 500 MG PO TABS
500.0000 mg | ORAL_TABLET | Freq: Two times a day (BID) | ORAL | 0 refills | Status: DC
  Filled 2022-03-27: qty 56, 28d supply, fill #0

## 2022-03-27 MED ORDER — OXYCODONE-ACETAMINOPHEN 5-325 MG PO TABS
1.0000 | ORAL_TABLET | Freq: Three times a day (TID) | ORAL | 0 refills | Status: DC | PRN
  Filled 2022-03-27: qty 15, 5d supply, fill #0

## 2022-03-27 NOTE — Consult Note (Signed)
[facility]    Date of Admission:  03/24/2022     Total days of antibiotics 2   Clindamycin               Reason for Consult: Tonsillar abscess, amox [place]    Referring Provider: Ghimire Primary Care Provider: Donnajean Lopes, MD    Assessment: Alyssa Howard is a 81 y.o. female admitted for management of tonsillar abscess now s/p I&D. Cultures polymicrobial with GPCs chains/pairs, GPRs and GNR. She has been doing well on IV clindamycin - we were consulted due to h/o pcn allergy and alternative tx options.  Augmentin would be perfect but leery to do amoxicillin challenge as it would delay discharge. Long discussion about pcn allergy - which at most is a very mild skin reaction. No severe type 1 reaction suspected. Increased side effects, CDiff risk and resistant strep re: clinda so would prefer to get her off this.  Alternatively we would recommend do 3rd gen cephalosporin + metronidazole BID for 3 weeks minimum.  Cefpodoxime is cost prohibitive (~$400) so will do cefdinir.    Plan: Will arrange ID follow up in ~2 weeks --> 10/18 @ 11:15 am with Dr. Gale Journey  Cefdinir + metronidazole BID (would dispense a month to have through [facility])     Principal Problem:   Peritonsillar abscess Active Problems:   Throat pain   Retropharyngeal abscess     HPI: Alyssa Howard is a 81 y.o. female admitted for management of severe sore throat - found to have right tonsillar abscess now s/p drainage from ENT.   PMHx emphysema, HLD, HTN, osteopenia.   She has been doing well on Clindamycin thusfar. Throat is sore as expected. Her husband and son are in the room for the history review.  She states she tried amoxicillin about 5 years ago and had hives on the skin. No other symptoms. Thinks that she may have had this for UTI treatment at that time. Tolerated ceftriaxone well in the past.  She is ready to go home.    Review of Systems: Review of Systems   Constitutional:  Negative for chills, fever and malaise/fatigue.  HENT:  Positive for sore throat.   Musculoskeletal:  Negative for neck pain.    Past Medical History:  Diagnosis Date   Dry eyes    Emphysema lung (HCC)    Hypercholesterolemia    Hypertension    Neck pain    Osteopenia    Osteopenia    Syncope and collapse    Vitamin D deficiency     Social History   Tobacco Use   Smoking status: Every Day    Packs/day: 0.50    Types: Cigarettes   Smokeless tobacco: Never  Vaping Use   Vaping Use: Never used  Substance Use Topics   Alcohol use: No   Drug use: No    Family History  Problem Relation Age of Onset   Diabetes Mother    Stroke Mother    Melanoma Sister    Heart disease Brother    Alzheimer's disease Brother    Allergies  Allergen Reactions   Penicillins Other (See Comments)    Pt's son states she is not allergic to penicillin  Tolerated ceftriaxone (2018)    Zetia [Ezetimibe] Other (See Comments)    Muscle deterioration     OBJECTIVE: Blood pressure 102/62, pulse 71, temperature 98.8 F (37.1 C), temperature source Oral, resp. rate Marland Kitchen)  21, height '5\' 6"'$  (1.676 m), weight 52 kg, SpO2 92 %.  Physical Exam  Lab Results Lab Results  Component Value Date   WBC 13.2 (H) 03/27/2022   HGB 13.3 03/27/2022   HCT 39.3 03/27/2022   MCV 94.9 03/27/2022   PLT 300 03/27/2022    Lab Results  Component Value Date   CREATININE 0.71 03/26/2022   BUN 27 (H) 03/26/2022   NA 140 03/26/2022   K 3.6 03/26/2022   CL 106 03/26/2022   CO2 24 03/26/2022    Lab Results  Component Value Date   ALT 18 11/14/2021   AST 28 11/14/2021   ALKPHOS 78 11/14/2021   BILITOT 0.7 11/14/2021     Microbiology: Recent Results (from the past 240 hour(s))  Group A Strep by PCR     Status: None   Collection Time: 03/24/22  6:47 PM   Specimen: Anterior Nasal Swab; Sterile Swab  Result Value Ref Range Status   Group A Strep by PCR NOT DETECTED NOT DETECTED Final     Comment: Performed at [facility], [address], [place], [place] [address]  Resp Panel by RT-PCR (Flu A&B, Covid) Anterior Nasal Swab     Status: None   Collection Time: 03/24/22  6:47 PM   Specimen: Anterior Nasal Swab  Result Value Ref Range Status   SARS Coronavirus 2 by RT PCR NEGATIVE NEGATIVE Final    Comment: (NOTE) SARS-CoV-2 target nucleic acids are NOT DETECTED.  The SARS-CoV-2 RNA is generally detectable in upper respiratory specimens during the acute phase of infection. The lowest concentration of SARS-CoV-2 viral copies this assay can detect is 138 copies/mL. A negative result does not preclude SARS-Cov-2 infection and should not be used as the sole basis for treatment or other patient management decisions. A negative result may occur with  improper specimen collection/handling, submission of specimen other than nasopharyngeal swab, presence of viral mutation(s) within the areas targeted by this assay, and inadequate number of viral copies(<138 copies/mL). A negative result must be combined with clinical observations, patient history, and epidemiological information. The expected result is Negative.  Fact Sheet for Patients:  EntrepreneurPulse.com.au  Fact Sheet for Healthcare Providers:  IncredibleEmployment.be  This test is no t yet approved or cleared by the [place] FDA and  has been authorized for detection and/or diagnosis of SARS-CoV-2 by FDA under an Emergency Use Authorization (EUA). This EUA will remain  in effect (meaning this test can be used) for the duration of the COVID-19 declaration under Section 564(b)(1) of the Act, 21 U.S.C.section 360bbb-3(b)(1), unless the authorization is terminated  or revoked sooner.       Influenza A by PCR NEGATIVE NEGATIVE Final   Influenza B by PCR NEGATIVE NEGATIVE Final    Comment: (NOTE) The Xpert Xpress SARS-CoV-2/FLU/RSV plus assay is intended as an  aid in the diagnosis of influenza from Nasopharyngeal swab specimens and should not be used as a sole basis for treatment. Nasal washings and aspirates are unacceptable for Xpert Xpress SARS-CoV-2/FLU/RSV testing.  Fact Sheet for Patients: EntrepreneurPulse.com.au  Fact Sheet for Healthcare Providers: IncredibleEmployment.be  This test is not yet approved or cleared by the [place] FDA and has been authorized for detection and/or diagnosis of SARS-CoV-2 by FDA under an Emergency Use Authorization (EUA). This EUA will remain in effect (meaning this test can be used) for the duration of the COVID-19 declaration under Section 564(b)(1) of the Act, 21 U.S.C. section 360bbb-3(b)(1), unless the authorization is terminated or  revoked.  Performed at [facility], [address] [place], [place] [address]   Aerobic/Anaerobic Culture w Gram Stain (surgical/deep wound)     Status: None (Preliminary result)   Collection Time: 03/25/22 11:21 PM   Specimen: PATH Cytology Misc. fluid; Body Fluid  Result Value Ref Range Status   Specimen Description WOUND  Final   Special Requests RETROPHARYNGEAL ABSC SPEC A  Final   Gram Stain   Final    FEW WBC PRESENT, PREDOMINANTLY MONONUCLEAR MODERATE GRAM POSITIVE COCCI IN PAIRS AND CHAINS MODERATE GRAM NEGATIVE RODS FEW GRAM POSITIVE RODS    Culture   Final    CULTURE REINCUBATED FOR BETTER GROWTH Performed at [facility], [place] [address] [place],  [address]    Report Status PENDING  Incomplete    Janene Madeira, MSN, NP-C [place] Pager: [phone]  03/27/2022 11:57 AM

## 2022-03-27 NOTE — Progress Notes (Signed)
         Date: 03/27/2022  Patient name: Alyssa Howard  Medical record number: [id number]  Date of birth: Apr 19, 1941   I was called by Dr. Sloan Leiter re this patient with peritonsillar abscess and cultures  I have recommended augmentin and to followup cultures  I spent 6 minutes reviewing the case and discussing with Dr. Sloan Leiter.    Rhina Brackett [place] 03/27/2022, 11:02 AM

## 2022-03-27 NOTE — TOC Transition Note (Signed)
Transition of Care [facility]) - CM/SW Discharge Note   Patient Details  Name: NOGA FOGG MRN: [id number] Date of Birth: May 16, 1941  Transition of Care [place]) CM/SW Contact:  Cyndi Bender, RN Phone Number: 03/27/2022, 12:15 PM   Clinical Narrative:     Patient stable for discharge.  Spoke to husband, Sonia Side, regarding transition needs. Sonia Side states patient was active with [facility] for home health and would like to use them again.  Amy with [facility] accepted referral  Patient has all needed DME except patient wants electric scooter. [place] with [employer] will contact Sonia Side due to this not being covered under insurance.    Address, Phone number and PCP verified.  Final next level of care: [place] Barriers to Discharge: Barriers Resolved   Patient Goals and CMS Choice Patient states their goals for this hospitalization and ongoing recovery are:: return home CMS Medicare.gov Compare Post Acute Care list provided to:: Patient Represenative (must comment) Choice offered to / list presented to : Spouse  Discharge Placement                       Discharge Plan and Services   Discharge Planning Services: CM Consult Post Acute Care Choice: Home Health                    HH Arranged: RN, PT [facility] Agency: [place] Date [place]: 03/27/22 Time [place]: 1157 Representative spoke with at [place]: Amy  Social Determinants of Health ([place]) Interventions     Readmission Risk Interventions    03/27/2022   12:13 PM  Readmission Risk Prevention Plan  Post Dischage Appt Complete  Medication Screening Complete  Transportation Screening Complete

## 2022-03-27 NOTE — Discharge Summary (Addendum)
PATIENT DETAILS Name: Alyssa Howard Age: 81 y.o. Sex: female Date of Birth: Aug 15, 1940 MRN: [id number]. Admitting Physician: Lenore Cordia, MD [id number], Ermalene Searing, MD  Admit Date: 03/24/2022 Discharge date: 03/27/2022  Recommendations for Outpatient Follow-up:  Follow up with PCP in 1-2 weeks Please obtain CMP/CBC in one week  Admitted From:  Home  Disposition: Home   Discharge Condition: good  CODE STATUS:   Code Status: Full Code   Diet recommendation:  Diet Order             Diet general           Diet Heart Room service appropriate? Yes; Fluid consistency: Thin  Diet effective now                    Brief Summary: Patient is a 81 y.o.  female with no significant past medical history-presented with peritonsillar abscess-underwent I&D and subsequently admitted to the hospitalist service.   Significant events: 10/1>> admit-peritonsillar abscess   Significant studies: 10/2>> CT neck: Right peritonsillar abscess.   Significant microbiology data: 10/1>> COVID/influenza PCR: Negative 10/2>> abscess culture: Pending   Procedures: 10/2>> incision and drainage   Consults: ENT   Brief Hospital Course: Peritonsillar abscess-right: S/p I&D on 10/2, managed with IV clindamycin. Diet gradually advanced-tolerating a regular diet at this point. Intraoperative culture still pending  but Gram stain data reviewed with infectious disease-initially plans were to switch to Augmentin-however patient apparently has an allergy with penicillins.  Upon further discussion with infectious disease-recommendations are to switch to cefpodoxime and Flagyl x4 weeks.  ID planning to see her in the clinic in 2 to 3 weeks-and if better-to stop antibiotics today.  BMI: Estimated body mass index is 18.5 kg/m as calculated from the following:   Height as of this encounter: '5\' 6"'$  (1.676 m).   Weight as of this encounter: 52 kg.    Discharge Diagnoses:  Principal  Problem:   Peritonsillar abscess Active Problems:   Throat pain   Retropharyngeal abscess   Discharge Instructions:  Activity:  As tolerated  Discharge Instructions     Call MD for:  difficulty breathing, headache or visual disturbances   Complete by: As directed    Call MD for:  extreme fatigue   Complete by: As directed    Call MD for:  persistant dizziness or light-headedness   Complete by: As directed    Call MD for:  persistant nausea and vomiting   Complete by: As directed    Call MD for:  redness, tenderness, or signs of infection (pain, swelling, redness, odor or green/yellow discharge around incision site)   Complete by: As directed    Diet general   Complete by: As directed    Discharge instructions   Complete by: As directed    Follow with Primary MD  Donnajean Lopes, MD in 1-2 weeks  Please get a complete blood count and chemistry panel checked by your Primary MD at your next visit, and again as instructed by your Primary MD.  Get Medicines reviewed and adjusted: Please take all your medications with you for your next visit with your Primary MD  Laboratory/radiological data: Please request your Primary MD to go over all hospital tests and procedure/radiological results at the follow up, please ask your Primary MD to get all Hospital records sent to his/her office.  In some cases, they will be blood work, cultures and biopsy results pending at the time of your discharge. Please request  that your primary care M.D. follows up on these results.  Also Note the following: If you experience worsening of your admission symptoms, develop shortness of breath, life threatening emergency, suicidal or homicidal thoughts you must seek medical attention immediately by calling 911 or calling your MD immediately  if symptoms less severe.  You must read complete instructions/literature along with all the possible adverse reactions/side effects for all the Medicines you take and  that have been prescribed to you. Take any new Medicines after you have completely understood and accpet all the possible adverse reactions/side effects.   Do not drive when taking Pain medications or sleeping medications (Benzodaizepines)  Do not take more than prescribed Pain, Sleep and Anxiety Medications. It is not advisable to combine anxiety,sleep and pain medications without talking with your primary care practitioner  Special Instructions: If you have smoked or chewed Tobacco  in the last 2 yrs please stop smoking, stop any regular Alcohol  and or any Recreational drug use.  Wear Seat belts while driving.  Please note: You were cared for by a hospitalist during your hospital stay. Once you are discharged, your primary care physician will handle any further medical issues. Please note that NO REFILLS for any discharge medications will be authorized once you are discharged, as it is imperative that you return to your primary care physician (or establish a relationship with a primary care physician if you do not have one) for your post hospital discharge needs so that they can reassess your need for medications and monitor your lab values.   Increase activity slowly   Complete by: As directed    No wound care   Complete by: As directed       Allergies as of 03/27/2022       Reactions   Penicillins Other (See Comments)   Pt's son states she is not allergic to penicillin Tolerated ceftriaxone (2018)    Zetia [ezetimibe] Other (See Comments)   Muscle deterioration         Medication List     TAKE these medications    cefdinir 300 MG capsule Commonly known as: OMNICEF Take 1 capsule (300 mg total) by mouth 2 (two) times daily.   GaviLAX 17 GM/SCOOP powder Generic drug: polyethylene glycol powder Take 17 g by mouth daily as needed (constipation).   metroNIDAZOLE 500 MG tablet Commonly known as: Flagyl Take 1 tablet (500 mg total) by mouth 2 (two) times daily for 28  days.   ondansetron 4 MG disintegrating tablet Commonly known as: ZOFRAN-ODT Take 4 mg by mouth 2 (two) times daily as needed for nausea/vomiting.   oxyCODONE-acetaminophen 5-325 MG tablet Commonly known as: Percocet Take 1 tablet by mouth every 8 (eight) hours as needed for severe pain.   TYLENOL PO Take 1-2 tablets by mouth daily as needed (pain, fever).   VITAMIN D-3 PO Take 1 capsule by mouth daily.        Follow-up Information     Donnajean Lopes, MD. Schedule an appointment as soon as possible for a visit in 1 week(s).   Specialty: Internal Medicine Contact information: [place] [address] [phone]         Jabier Mutton, MD Follow up on 04/10/2022.   Specialty: Infectious Diseases Why: Hospital Discharge Follow Up 11:15 am appointment Contact information: [address] [address] [place]. Follow up.  Why: Home health has been arranged. They will contact you to schedule apt. Contact information: [address] [place] [address] [phone]                Allergies  Allergen Reactions   Penicillins Other (See Comments)    Pt's son states she is not allergic to penicillin  Tolerated ceftriaxone (2018)    Zetia [Ezetimibe] Other (See Comments)    Muscle deterioration      Other Procedures/Studies: CT Soft Tissue Neck W Contrast  Result Date: 03/25/2022 CLINICAL DATA:  Sore throat the began 3-5 days ago, decreased p.o. intake EXAM: CT NECK WITH CONTRAST TECHNIQUE: Multidetector CT imaging of the neck was performed using the standard protocol following the bolus administration of intravenous contrast. RADIATION DOSE REDUCTION: This exam was performed according to the departmental dose-optimization program which includes automated exposure control, adjustment of the mA and/or kV according to patient size and/or use of iterative reconstruction  technique. CONTRAST:  23m OMNIPAQUE IOHEXOL 300 MG/ML  SOLN COMPARISON:  None Available. FINDINGS: Evaluation is somewhat limited by beam hardening artifact from the patient's dental hardware pharynx and larynx: Enlargement of the inferior aspect of the right palatine tonsil with a low-density collection with peripheral enhancement that measures 1.2 x 3.7 x 2.5 cm (AP x TR x CC) (series 2, image 56 and series 5, image 86), concerning for abscess. This may extend medially into the retropharyngeal space. Salivary glands: Mild atrophy of the parotid glands. The submandibular glands are unremarkable. No inflammation, mass, or stone. Thyroid: Normal. Lymph nodes: No definite lymphadenopathy, although evaluation is somewhat limited by beam hardening artifact from the patient's dental hardware. Vascular: Patent vasculature. Limited intracranial: No acute finding. Visualized orbits: Status post bilateral lens replacements. No acute finding. Mastoids and visualized paranasal sinuses: Clear. Skeleton: Degenerative changes in the cervical spine. No acute osseous abnormality. Upper chest: Emphysema. No focal pulmonary opacity or pleural effusion. Aortic atherosclerosis. Other: None. IMPRESSION: Enlargement of the inferior aspect of the right palatine tonsil, with a low-density collection with peripheral enhancement that measures 1.2 x 3.7 x 2.5 cm, most likely an abscess. This may extend medially into the retropharyngeal space. Electronically Signed   By: AMerilyn BabaM.D.   On: 03/25/2022 01:05   DG Chest 2 View  Result Date: 03/24/2022 CLINICAL DATA:  Sore throat x3 days. EXAM: CHEST - 2 VIEW COMPARISON:  Nov 09, 2016 FINDINGS: The heart size and mediastinal contours are within normal limits. There is moderate severity calcification of the aortic arch and tortuosity of the descending thoracic aorta. The lungs are hyperinflated. Emphysematous lung disease is noted with mild linear atelectasis is seen within the lateral  aspect of the right lung base. There is no evidence of a pleural effusion or pneumothorax. A chronic compression fracture deformity is seen within the lower thoracic spine. This represents a new finding when compared to the prior study. IMPRESSION: COPD with mild right basilar linear atelectasis. Electronically Signed   By: TVirgina NorfolkM.D.   On: 03/24/2022 21:56     TODAY-DAY OF DISCHARGE:  Subjective:   BCathryn Gallerytoday has no headache,no chest abdominal pain,no new weakness tingling or numbness, feels much better wants to go home today.   Objective:   Blood pressure 102/62, pulse 71, temperature 98.8 F (37.1 C), temperature source Oral, resp. rate (!) 21, height '5\' 6"'$  (1.676 m), weight 52 kg, SpO2 92 %.  Intake/Output Summary (Last 24 hours) at 03/27/2022 1257 Last data  filed at 03/27/2022 0900 Gross per 24 hour  Intake 240 ml  Output --  Net 240 ml   Filed Weights   03/24/22 1844  Weight: 52 kg    Exam: Awake Alert, Oriented *3, No new F.N deficits, Normal affect [place].AT,PERRAL Supple Neck,No JVD, No cervical lymphadenopathy appriciated.  Symmetrical Chest wall movement, Good air movement bilaterally, CTAB RRR,No Gallops,Rubs or new Murmurs, No Parasternal Heave +ve B.Sounds, Abd Soft, Non tender, No organomegaly appriciated, No rebound -guarding or rigidity. No Cyanosis, Clubbing or edema, No new Rash or bruise   PERTINENT RADIOLOGIC STUDIES: No results found.   PERTINENT LAB RESULTS: CBC: Recent Labs    03/26/22 0403 03/27/22 0507  WBC 16.3* 13.2*  HGB 13.5 13.3  HCT 38.6 39.3  PLT 282 300   CMET CMP     Component Value Date/Time   NA 140 03/26/2022 0403   K 3.6 03/26/2022 0403   CL 106 03/26/2022 0403   CO2 24 03/26/2022 0403   GLUCOSE 137 (H) 03/26/2022 0403   BUN 27 (H) 03/26/2022 0403   CREATININE 0.71 03/26/2022 0403   CALCIUM 9.0 03/26/2022 0403   PROT 6.9 11/14/2021 2341   ALBUMIN 4.1 11/14/2021 2341   AST 28 11/14/2021 2341   ALT  18 11/14/2021 2341   ALKPHOS 78 11/14/2021 2341   BILITOT 0.7 11/14/2021 2341   GFRNONAA >60 03/26/2022 0403   GFRAA >60 02/14/2017 1742    GFR Estimated Creatinine Clearance: 45.3 mL/min (by C-G formula based on SCr of 0.71 mg/dL). No results for input(s): "LIPASE", "AMYLASE" in the last 72 hours. No results for input(s): "CKTOTAL", "CKMB", "CKMBINDEX", "TROPONINI" in the last 72 hours. Invalid input(s): "POCBNP" No results for input(s): "DDIMER" in the last 72 hours. No results for input(s): "HGBA1C" in the last 72 hours. No results for input(s): "CHOL", "HDL", "LDLCALC", "TRIG", "CHOLHDL", "LDLDIRECT" in the last 72 hours. No results for input(s): "TSH", "T4TOTAL", "T3FREE", "THYROIDAB" in the last 72 hours.  Invalid input(s): "FREET3" No results for input(s): "VITAMINB12", "FOLATE", "FERRITIN", "TIBC", "IRON", "RETICCTPCT" in the last 72 hours. Coags: No results for input(s): "INR" in the last 72 hours.  Invalid input(s): "PT" Microbiology: Recent Results (from the past 240 hour(s))  Group A Strep by PCR     Status: None   Collection Time: 03/24/22  6:47 PM   Specimen: Anterior Nasal Swab; Sterile Swab  Result Value Ref Range Status   Group A Strep by PCR NOT DETECTED NOT DETECTED Final    Comment: Performed at [facility], [address] [place],  [address]  Resp Panel by RT-PCR (Flu A&B, Covid) Anterior Nasal Swab     Status: None   Collection Time: 03/24/22  6:47 PM   Specimen: Anterior Nasal Swab  Result Value Ref Range Status   SARS Coronavirus 2 by RT PCR NEGATIVE NEGATIVE Final    Comment: (NOTE) SARS-CoV-2 target nucleic acids are NOT DETECTED.  The SARS-CoV-2 RNA is generally detectable in upper respiratory specimens during the acute phase of infection. The lowest concentration of SARS-CoV-2 viral copies this assay can detect is 138 copies/mL. A negative result does not preclude SARS-Cov-2 infection and should not be used as the  sole basis for treatment or other patient management decisions. A negative result may occur with  improper specimen collection/handling, submission of specimen other than nasopharyngeal swab, presence of viral mutation(s) within the areas targeted by this assay, and inadequate number of viral copies(<138 copies/mL). A negative result must be combined with clinical  observations, patient history, and epidemiological information. The expected result is Negative.  Fact Sheet for Patients:  EntrepreneurPulse.com.au  Fact Sheet for Healthcare Providers:  IncredibleEmployment.be  This test is no t yet approved or cleared by the [place] FDA and  has been authorized for detection and/or diagnosis of SARS-CoV-2 by FDA under an Emergency Use Authorization (EUA). This EUA will remain  in effect (meaning this test can be used) for the duration of the COVID-19 declaration under Section 564(b)(1) of the Act, 21 U.S.C.section 360bbb-3(b)(1), unless the authorization is terminated  or revoked sooner.       Influenza A by PCR NEGATIVE NEGATIVE Final   Influenza B by PCR NEGATIVE NEGATIVE Final    Comment: (NOTE) The Xpert Xpress SARS-CoV-2/FLU/RSV plus assay is intended as an aid in the diagnosis of influenza from Nasopharyngeal swab specimens and should not be used as a sole basis for treatment. Nasal washings and aspirates are unacceptable for Xpert Xpress SARS-CoV-2/FLU/RSV testing.  Fact Sheet for Patients: EntrepreneurPulse.com.au  Fact Sheet for Healthcare Providers: IncredibleEmployment.be  This test is not yet approved or cleared by the [place] FDA and has been authorized for detection and/or diagnosis of SARS-CoV-2 by FDA under an Emergency Use Authorization (EUA). This EUA will remain in effect (meaning this test can be used) for the duration of the COVID-19 declaration under Section 564(b)(1) of the  Act, 21 U.S.C. section 360bbb-3(b)(1), unless the authorization is terminated or revoked.  Performed at [facility], [address], [place], [place] [address]   Aerobic/Anaerobic Culture w Gram Stain (surgical/deep wound)     Status: None (Preliminary result)   Collection Time: 03/25/22 11:21 PM   Specimen: PATH Cytology Misc. fluid; Body Fluid  Result Value Ref Range Status   Specimen Description WOUND  Final   Special Requests RETROPHARYNGEAL ABSC SPEC A  Final   Gram Stain   Final    FEW WBC PRESENT, PREDOMINANTLY MONONUCLEAR MODERATE GRAM POSITIVE COCCI IN PAIRS AND CHAINS MODERATE GRAM NEGATIVE RODS FEW GRAM POSITIVE RODS    Culture   Final    CULTURE REINCUBATED FOR BETTER GROWTH Performed at [facility], [place] [address], [place], [place] [address]    Report Status PENDING  Incomplete    FURTHER DISCHARGE INSTRUCTIONS:  Get Medicines reviewed and adjusted: Please take all your medications with you for your next visit with your Primary MD  Laboratory/radiological data: Please request your Primary MD to go over all hospital tests and procedure/radiological results at the follow up, please ask your Primary MD to get all Hospital records sent to his/her office.  In some cases, they will be blood work, cultures and biopsy results pending at the time of your discharge. Please request that your primary care M.D. goes through all the records of your hospital data and follows up on these results.  Also Note the following: If you experience worsening of your admission symptoms, develop shortness of breath, life threatening emergency, suicidal or homicidal thoughts you must seek medical attention immediately by calling 911 or calling your MD immediately  if symptoms less severe.  You must read complete instructions/literature along with all the possible adverse reactions/side effects for all the Medicines you take and that have been prescribed to you.  Take any new Medicines after you have completely understood and accpet all the possible adverse reactions/side effects.   Do not drive when taking Pain medications or sleeping medications (Benzodaizepines)  Do not take more than prescribed Pain, Sleep and  Anxiety Medications. It is not advisable to combine anxiety,sleep and pain medications without talking with your primary care practitioner  Special Instructions: If you have smoked or chewed Tobacco  in the last 2 yrs please stop smoking, stop any regular Alcohol  and or any Recreational drug use.  Wear Seat belts while driving.  Please note: You were cared for by a hospitalist during your hospital stay. Once you are discharged, your primary care physician will handle any further medical issues. Please note that NO REFILLS for any discharge medications will be authorized once you are discharged, as it is imperative that you return to your primary care physician (or establish a relationship with a primary care physician if you do not have one) for your post hospital discharge needs so that they can reassess your need for medications and monitor your lab values.  Total Time spent coordinating discharge including counseling, education and face to face time equals greater than 30 minutes.  SignedOren Binet 03/27/2022 12:57 PM

## 2022-03-28 DIAGNOSIS — E860 Dehydration: Secondary | ICD-10-CM

## 2022-03-29 LAB — AEROBIC/ANAEROBIC CULTURE W GRAM STAIN (SURGICAL/DEEP WOUND)

## 2022-04-05 DIAGNOSIS — R2681 Unsteadiness on feet: Secondary | ICD-10-CM | POA: Diagnosis not present

## 2022-04-05 DIAGNOSIS — Z23 Encounter for immunization: Secondary | ICD-10-CM | POA: Diagnosis not present

## 2022-04-05 DIAGNOSIS — J439 Emphysema, unspecified: Secondary | ICD-10-CM | POA: Diagnosis not present

## 2022-04-05 DIAGNOSIS — I10 Essential (primary) hypertension: Secondary | ICD-10-CM | POA: Diagnosis not present

## 2022-04-05 DIAGNOSIS — R197 Diarrhea, unspecified: Secondary | ICD-10-CM | POA: Diagnosis not present

## 2022-04-05 DIAGNOSIS — J36 Peritonsillar abscess: Secondary | ICD-10-CM | POA: Diagnosis not present

## 2022-04-05 DIAGNOSIS — M8000XD Age-related osteoporosis with current pathological fracture, unspecified site, subsequent encounter for fracture with routine healing: Secondary | ICD-10-CM | POA: Diagnosis not present

## 2022-04-10 ENCOUNTER — Other Ambulatory Visit: Payer: Self-pay

## 2022-04-10 ENCOUNTER — Ambulatory Visit (INDEPENDENT_AMBULATORY_CARE_PROVIDER_SITE_OTHER): Payer: Medicare Other | Admitting: Internal Medicine

## 2022-04-10 ENCOUNTER — Encounter: Payer: Self-pay | Admitting: Internal Medicine

## 2022-04-10 VITALS — BP 125/80 | HR 99 | Resp 16 | Ht 66.0 in | Wt 114.6 lb

## 2022-04-10 DIAGNOSIS — J390 Retropharyngeal and parapharyngeal abscess: Secondary | ICD-10-CM

## 2022-04-10 NOTE — Progress Notes (Signed)
[facility]  Patient Active Problem List   Diagnosis Date Noted   Dehydration    Peritonsillar abscess 03/25/2022   Throat pain    Retropharyngeal abscess    Fracture, [place], left, closed 11/07/2020   HYPERLIPIDEMIA 01/01/2007   TOBACCO USER 01/01/2007      Subjective:    Patient ID: Alyssa Howard, female    DOB: 08-Jul-1940, 81 y.o.   MRN: [id number]  Chief Complaint  Patient presents with   Follow-up    HPI:  Alyssa Howard is a 81 y.o. female here for hospital f/u tonsillar/retropharyngeal abscess  She came in 10/1 with throat pain found to have right sided tonsillar abscess and retropharyngeal involvement on ct scan She underwent I&D by ent No mrsa/mssa on cx No drain placed. She was discharged with omnicef/flagyl She has pcn allergy and didn't want to do amox challenge during admission  She is here today 04/10/22 on f/u No further throat pain No f/c  Lots of nausea and poor intake from the abx No diarrhea No rash    Allergies  Allergen Reactions   Albuterol Sulfate     Other reaction(s): rash in mouth,   Aspirin Nausea Only   Codeine Nausea Only   Dorzolamide Hcl-Timolol Mal Other (See Comments)   Penicillin G Sodium     Other reaction(s): she had a rash with use of augmentin   Penicillins Other (See Comments)    Pt's son states she is not allergic to penicillin  Tolerated ceftriaxone (2018)    Statins     Other reaction(s): Intolerance (side effects from meds)   Zetia [Ezetimibe] Other (See Comments)    Muscle deterioration       Outpatient Medications Prior to Visit  Medication Sig Dispense Refill   Acetaminophen (TYLENOL PO) Take 1-2 tablets by mouth daily as needed (pain, fever).     cefdinir (OMNICEF) 300 MG capsule Take 1 capsule (300 mg total) by mouth 2 (two) times daily. 56 capsule 0   Cholecalciferol (VITAMIN D-3 PO) Take 1 capsule by mouth daily.     ciprofloxacin (CIPRO) 500 MG tablet 1  tablet Orally Twice a day for 5 days     GAVILAX 17 GM/SCOOP powder Take 17 g by mouth daily as needed (constipation).     ondansetron (ZOFRAN-ODT) 4 MG disintegrating tablet Take 4 mg by mouth 2 (two) times daily as needed for nausea/vomiting.     oxyCODONE-acetaminophen (PERCOCET) 5-325 MG tablet Take 1 tablet by mouth every 8 (eight) hours as needed for severe pain. 15 tablet 0   metroNIDAZOLE (FLAGYL) 500 MG tablet Take 1 tablet (500 mg total) by mouth 2 (two) times daily for 28 days. 56 tablet 0   No facility-administered medications prior to visit.     Social History   Socioeconomic History   Marital status: Married    Spouse name: Not on file   Number of children: Not on file   Years of education: Not on file   Highest education level: Not on file  Occupational History   Not on file  Tobacco Use   Smoking status: Every Day    Packs/day: 0.50    Types: Cigarettes   Smokeless tobacco: Never  Vaping Use   Vaping Use: Never used  Substance and Sexual Activity   Alcohol use: No   Drug use: No   Sexual activity: Yes  Other Topics Concern   Not on file  Social  History Narrative   Not on file   Social Determinants of Health   Financial Resource Strain: Not on file  Food Insecurity: Not on file  Transportation Needs: Not on file  Physical Activity: Not on file  Stress: Not on file  Social Connections: Not on file  Intimate Partner Violence: Not on file      Review of Systems    All other ros negative  Objective:    Resp 16   Ht '5\' 6"'$  (1.676 m)   BMI 18.50 kg/m  Nursing note and vital signs reviewed.  Physical Exam     In wheel chair; no distress; conversant Heent: normocephalic; oropharynx clear no tonsillar purulence/swelling/redness; no submandibular swelling/tenderness Neck: supple nontender Cv: rrr no mrg Lungs: normal respiratory effort Abd: s/nt Ext no edema Skin: no rash   Labs: Lab Results  Component Value Date   WBC 13.2 (H)  03/27/2022   HGB 13.3 03/27/2022   HCT 39.3 03/27/2022   MCV 94.9 03/27/2022   PLT 300 72/90/2111   Last metabolic panel Lab Results  Component Value Date   GLUCOSE 137 (H) 03/26/2022   NA 140 03/26/2022   K 3.6 03/26/2022   CL 106 03/26/2022   CO2 24 03/26/2022   BUN 27 (H) 03/26/2022   CREATININE 0.71 03/26/2022   GFRNONAA >60 03/26/2022   CALCIUM 9.0 03/26/2022   PROT 6.9 11/14/2021   ALBUMIN 4.1 11/14/2021   BILITOT 0.7 11/14/2021   ALKPHOS 78 11/14/2021   AST 28 11/14/2021   ALT 18 11/14/2021   ANIONGAP 10 03/26/2022    Micro:  Serology:  Imaging: 10/2 neck ct Enlargement of the inferior aspect of the right palatine tonsil, with a low-density collection with peripheral enhancement that measures 1.2 x 3.7 x 2.5 cm, most likely an abscess. This may extend medially into the retropharyngeal space.   Assessment & Plan:   Problem List Items Addressed This Visit       Other   Retropharyngeal abscess - Primary      No orders of the defined types were placed in this encounter.    81 yo female admitted with tonsillar abscess complicated by retropharyngeal involvement s/p I&D, chart hx amox mild allergy   Reviewed allergy with patient. Rash with amox although prior amox use was ok; rash occurred about 5 years prior doesn't recall in what context   From ENT standpoint the abscess is drained well    Doing well from infection standpoint but significant side effect from abx  Exam today without finding of infection persistence. Given side effect will stop abx and see how she does in 2 weeks     10/02 I&D cx strep constellatus, prevotella and gpr on stain but no growth   Amox allergy reasonable to get outpatient testing. Tolerated 3rd gen cephalosporin    -stop cefdinir/flagyl -f/u 2 weeks -f/u sooner if neck pain, fever, chill -advise near future outpatient allergy pcn skin testing   Follow-up: Return in about 2 weeks (around  04/24/2022).      Jabier Mutton, [facility] 04/10/2022, 11:23 AM

## 2022-04-10 NOTE — Patient Instructions (Addendum)
I do  not see evidence of any further tonsillar infection or the space below the jaw infection.  Let's stop antibiotics today (omnicef and flagyl)  Let's revisit in 2 weeks   If neck pain, fever, chill, tonsillar swelling occurs sooner please let us know    I would also advise seeing an allergist to do penicillin skin testing -- it is a good antibiotics and we would rather have it to use if needed

## 2022-04-19 DIAGNOSIS — R197 Diarrhea, unspecified: Secondary | ICD-10-CM | POA: Diagnosis not present

## 2022-04-24 ENCOUNTER — Ambulatory Visit: Payer: Medicare Other | Admitting: Infectious Disease

## 2022-05-15 DIAGNOSIS — R2681 Unsteadiness on feet: Secondary | ICD-10-CM | POA: Diagnosis not present

## 2022-05-15 DIAGNOSIS — I10 Essential (primary) hypertension: Secondary | ICD-10-CM | POA: Diagnosis not present

## 2022-05-15 DIAGNOSIS — M40209 Unspecified kyphosis, site unspecified: Secondary | ICD-10-CM | POA: Diagnosis not present

## 2022-05-15 DIAGNOSIS — M542 Cervicalgia: Secondary | ICD-10-CM | POA: Diagnosis not present

## 2022-05-15 DIAGNOSIS — J36 Peritonsillar abscess: Secondary | ICD-10-CM | POA: Diagnosis not present

## 2022-05-15 DIAGNOSIS — M8580 Other specified disorders of bone density and structure, unspecified site: Secondary | ICD-10-CM | POA: Diagnosis not present

## 2022-05-15 DIAGNOSIS — E559 Vitamin D deficiency, unspecified: Secondary | ICD-10-CM | POA: Diagnosis not present

## 2022-05-15 DIAGNOSIS — G542 Cervical root disorders, not elsewhere classified: Secondary | ICD-10-CM | POA: Diagnosis not present

## 2022-05-15 DIAGNOSIS — Z87891 Personal history of nicotine dependence: Secondary | ICD-10-CM | POA: Diagnosis not present

## 2022-05-15 DIAGNOSIS — M8000XD Age-related osteoporosis with current pathological fracture, unspecified site, subsequent encounter for fracture with routine healing: Secondary | ICD-10-CM | POA: Diagnosis not present

## 2022-05-15 DIAGNOSIS — J439 Emphysema, unspecified: Secondary | ICD-10-CM | POA: Diagnosis not present

## 2022-05-21 DIAGNOSIS — J439 Emphysema, unspecified: Secondary | ICD-10-CM | POA: Diagnosis not present

## 2022-05-21 DIAGNOSIS — R2681 Unsteadiness on feet: Secondary | ICD-10-CM | POA: Diagnosis not present

## 2022-05-21 DIAGNOSIS — M8000XD Age-related osteoporosis with current pathological fracture, unspecified site, subsequent encounter for fracture with routine healing: Secondary | ICD-10-CM | POA: Diagnosis not present

## 2022-05-21 DIAGNOSIS — I10 Essential (primary) hypertension: Secondary | ICD-10-CM | POA: Diagnosis not present

## 2022-05-21 DIAGNOSIS — E559 Vitamin D deficiency, unspecified: Secondary | ICD-10-CM | POA: Diagnosis not present

## 2022-05-21 DIAGNOSIS — J36 Peritonsillar abscess: Secondary | ICD-10-CM | POA: Diagnosis not present

## 2022-05-23 DIAGNOSIS — R2681 Unsteadiness on feet: Secondary | ICD-10-CM | POA: Diagnosis not present

## 2022-05-23 DIAGNOSIS — J36 Peritonsillar abscess: Secondary | ICD-10-CM | POA: Diagnosis not present

## 2022-05-23 DIAGNOSIS — E559 Vitamin D deficiency, unspecified: Secondary | ICD-10-CM | POA: Diagnosis not present

## 2022-05-23 DIAGNOSIS — J439 Emphysema, unspecified: Secondary | ICD-10-CM | POA: Diagnosis not present

## 2022-05-23 DIAGNOSIS — M8000XD Age-related osteoporosis with current pathological fracture, unspecified site, subsequent encounter for fracture with routine healing: Secondary | ICD-10-CM | POA: Diagnosis not present

## 2022-05-23 DIAGNOSIS — I10 Essential (primary) hypertension: Secondary | ICD-10-CM | POA: Diagnosis not present

## 2022-05-24 DIAGNOSIS — I10 Essential (primary) hypertension: Secondary | ICD-10-CM | POA: Diagnosis not present

## 2022-05-24 DIAGNOSIS — R110 Nausea: Secondary | ICD-10-CM | POA: Diagnosis not present

## 2022-05-24 DIAGNOSIS — R197 Diarrhea, unspecified: Secondary | ICD-10-CM | POA: Diagnosis not present

## 2022-05-24 DIAGNOSIS — Z8619 Personal history of other infectious and parasitic diseases: Secondary | ICD-10-CM | POA: Diagnosis not present

## 2022-05-27 DIAGNOSIS — R197 Diarrhea, unspecified: Secondary | ICD-10-CM | POA: Diagnosis not present

## 2022-05-29 DIAGNOSIS — R82998 Other abnormal findings in urine: Secondary | ICD-10-CM | POA: Diagnosis not present

## 2022-06-03 DIAGNOSIS — A0472 Enterocolitis due to Clostridium difficile, not specified as recurrent: Secondary | ICD-10-CM | POA: Diagnosis not present

## 2022-06-03 DIAGNOSIS — Z8619 Personal history of other infectious and parasitic diseases: Secondary | ICD-10-CM | POA: Diagnosis not present

## 2022-06-03 DIAGNOSIS — I10 Essential (primary) hypertension: Secondary | ICD-10-CM | POA: Diagnosis not present

## 2022-06-11 DIAGNOSIS — M810 Age-related osteoporosis without current pathological fracture: Secondary | ICD-10-CM | POA: Diagnosis not present

## 2022-06-11 DIAGNOSIS — I10 Essential (primary) hypertension: Secondary | ICD-10-CM | POA: Diagnosis not present

## 2022-06-11 DIAGNOSIS — R2681 Unsteadiness on feet: Secondary | ICD-10-CM | POA: Diagnosis not present

## 2022-06-11 DIAGNOSIS — R251 Tremor, unspecified: Secondary | ICD-10-CM | POA: Diagnosis not present

## 2022-06-11 DIAGNOSIS — R410 Disorientation, unspecified: Secondary | ICD-10-CM | POA: Diagnosis not present

## 2022-06-11 DIAGNOSIS — J439 Emphysema, unspecified: Secondary | ICD-10-CM | POA: Diagnosis not present

## 2022-06-14 DIAGNOSIS — G542 Cervical root disorders, not elsewhere classified: Secondary | ICD-10-CM | POA: Diagnosis not present

## 2022-06-14 DIAGNOSIS — R2681 Unsteadiness on feet: Secondary | ICD-10-CM | POA: Diagnosis not present

## 2022-06-14 DIAGNOSIS — Z87891 Personal history of nicotine dependence: Secondary | ICD-10-CM | POA: Diagnosis not present

## 2022-06-14 DIAGNOSIS — M8000XD Age-related osteoporosis with current pathological fracture, unspecified site, subsequent encounter for fracture with routine healing: Secondary | ICD-10-CM | POA: Diagnosis not present

## 2022-06-14 DIAGNOSIS — J439 Emphysema, unspecified: Secondary | ICD-10-CM | POA: Diagnosis not present

## 2022-06-14 DIAGNOSIS — E559 Vitamin D deficiency, unspecified: Secondary | ICD-10-CM | POA: Diagnosis not present

## 2022-06-14 DIAGNOSIS — M8580 Other specified disorders of bone density and structure, unspecified site: Secondary | ICD-10-CM | POA: Diagnosis not present

## 2022-06-14 DIAGNOSIS — J36 Peritonsillar abscess: Secondary | ICD-10-CM | POA: Diagnosis not present

## 2022-06-14 DIAGNOSIS — M542 Cervicalgia: Secondary | ICD-10-CM | POA: Diagnosis not present

## 2022-06-14 DIAGNOSIS — I10 Essential (primary) hypertension: Secondary | ICD-10-CM | POA: Diagnosis not present

## 2022-06-14 DIAGNOSIS — M40209 Unspecified kyphosis, site unspecified: Secondary | ICD-10-CM | POA: Diagnosis not present

## 2022-07-01 DIAGNOSIS — R110 Nausea: Secondary | ICD-10-CM | POA: Diagnosis not present

## 2022-07-01 DIAGNOSIS — Z0000 Encounter for general adult medical examination without abnormal findings: Secondary | ICD-10-CM | POA: Diagnosis not present

## 2022-07-01 DIAGNOSIS — Z8619 Personal history of other infectious and parasitic diseases: Secondary | ICD-10-CM | POA: Diagnosis not present

## 2022-07-01 DIAGNOSIS — R197 Diarrhea, unspecified: Secondary | ICD-10-CM | POA: Diagnosis not present

## 2022-07-02 DIAGNOSIS — J439 Emphysema, unspecified: Secondary | ICD-10-CM | POA: Diagnosis not present

## 2022-07-02 DIAGNOSIS — I10 Essential (primary) hypertension: Secondary | ICD-10-CM | POA: Diagnosis not present

## 2022-07-02 DIAGNOSIS — E559 Vitamin D deficiency, unspecified: Secondary | ICD-10-CM | POA: Diagnosis not present

## 2022-07-02 DIAGNOSIS — M8000XD Age-related osteoporosis with current pathological fracture, unspecified site, subsequent encounter for fracture with routine healing: Secondary | ICD-10-CM | POA: Diagnosis not present

## 2022-07-02 DIAGNOSIS — R2681 Unsteadiness on feet: Secondary | ICD-10-CM | POA: Diagnosis not present

## 2022-07-02 DIAGNOSIS — J36 Peritonsillar abscess: Secondary | ICD-10-CM | POA: Diagnosis not present

## 2022-07-03 ENCOUNTER — Ambulatory Visit: Payer: Medicare Other | Admitting: Infectious Diseases

## 2022-07-03 DIAGNOSIS — A0471 Enterocolitis due to Clostridium difficile, recurrent: Secondary | ICD-10-CM | POA: Insufficient documentation

## 2022-07-03 NOTE — Progress Notes (Unsigned)
Patient: Alyssa Howard  DOB: 02/26/1941 MRN: [id number] PCP: Alyssa Lopes, MD     Subjective:  Alyssa Howard is a 82 y.o. female here for referral to assist with recurrent cdiff infection x 3.   Referred from [facility], Dr. Philip Howard.   Hospitalized in October 2023 for peritonsillar abscess and treated with 4week course of cefdinir and flagyl.  Shortly after completion she developed profuse diarrhea, abdominal pain/bloating and fevers. Positive toxigenic CDiff NAAT 04/19/2022.   Most recently rx for dificid 200 mg BID sent in for 10d course but was not approved. Now back on 250 mg caps Vancomycin QID enough for 14d worth. In the system I only see where she has had 5 and 7d supply given to her   She has been on a few short courses of vancomycin 250 mg tabs.  Visit in December reported "positive CDiff culture" on 05/27/22 - PO vancomycin started with complete resolution of diarrhea and abdominal pain described them.    First time in October was treated with vancomycin 250 mg capsules (2 tablets TID x 10d, then 1 tablet daily for 10 days).  Second time same treatment process Third time - they have not yet started the vancomycin but this time was instructed to pick up 250 mg tabs for QID dosing to continue for 14 days. They had about half at the pharmacy and cost was about $300 for this.   She has had positive toxigenic CDiff NAAT testing on 04/19/22, 05/27/22 and again 07/01/2022 She has bowel smears every 2 hours and larger watery volume stools 3 times a day. She has a decent appetite. Still with abdominal pain and distension. No fevers or chills. Labs from PCP have revealed no concern for organ dysfunction. She has been very weak over the last few months during these illnesses   Her son wonders about addition of probiotics to help repopulate the gut - feels that this may have been a missed opportunity. Her husband also worries that she may be re-infecting  herself because she has only been using hand sanitizer for hand hygiene. He would also like to go over hospital stay for odontogenic infection and if there was a link to current colitis.    Review of Systems  Constitutional:  Positive for malaise/fatigue. Negative for chills, fever and weight loss.  Gastrointestinal:  Positive for abdominal pain and diarrhea. Negative for blood in stool.  Genitourinary: Negative.   Musculoskeletal: Negative.   Neurological:  Positive for weakness. Negative for dizziness and headaches.      Past Medical History:  Diagnosis Date   Dry eyes    Emphysema lung (HCC)    Hypercholesterolemia    Hypertension    Neck pain    Osteopenia    Osteopenia    Syncope and collapse    Vitamin D deficiency     Outpatient Medications Prior to Visit  Medication Sig Dispense Refill   Acetaminophen (TYLENOL PO) Take 1-2 tablets by mouth daily as needed (pain, fever).     Cholecalciferol (VITAMIN D-3 PO) Take 1 capsule by mouth daily.     GAVILAX 17 GM/SCOOP powder Take 17 g by mouth daily as needed (constipation).     ondansetron (ZOFRAN-ODT) 4 MG disintegrating tablet Take 4 mg by mouth 2 (two) times daily as needed for nausea/vomiting.     oxyCODONE-acetaminophen (PERCOCET) 5-325 MG tablet Take 1 tablet by mouth every 8 (eight) hours as needed for severe pain. 15 tablet 0  vancomycin (VANCOCIN) 250 MG capsule Take 250 mg by mouth 4 (four) times daily.     cefdinir (OMNICEF) 300 MG capsule Take 1 capsule (300 mg total) by mouth 2 (two) times daily. 56 capsule 0   ciprofloxacin (CIPRO) 500 MG tablet 1 tablet Orally Twice a day for 5 days     No facility-administered medications prior to visit.     Allergies  Allergen Reactions   Albuterol Sulfate     Other reaction(s): rash in mouth,   Aspirin Nausea Only   Codeine Nausea Only   Dorzolamide Hcl-Timolol Mal Other (See Comments)   Penicillin G Sodium     Other reaction(s): she had a rash with use of augmentin    Penicillins Other (See Comments)    Pt's son states she is not allergic to penicillin  Tolerated ceftriaxone (2018)    Statins     Other reaction(s): Intolerance (side effects from meds)   Zetia [Ezetimibe] Other (See Comments)    Muscle deterioration     Social History   Tobacco Use   Smoking status: Former    Packs/day: 0.50    Types: Cigarettes   Smokeless tobacco: Never  Vaping Use   Vaping Use: Never used  Substance Use Topics   Alcohol use: No   Drug use: No    Family History  Problem Relation Age of Onset   Diabetes Mother    Stroke Mother    Melanoma Sister    Heart disease Brother    Alzheimer's disease Brother     Objective:   Vitals:   07/04/22 1401  BP: 133/83  Pulse: 96  Temp: (!) 96.9 F (36.1 C)  TempSrc: Temporal   There is no height or weight on file to calculate BMI.   Physical Exam Constitutional:      Appearance: Normal appearance. She is ill-appearing.     Comments: Chronically ill appearing in wheelchair. Husband and son join US   Abdominal:     General: Bowel sounds are normal. There is distension.     Palpations: Abdomen is soft.     Tenderness: There is abdominal tenderness.  Skin:    General: Skin is warm and dry.     Coloration: Skin is pale.  Neurological:     Mental Status: She is alert and oriented to person, place, and time.     Lab Results:    Assessment & Plan:   Problem List Items Addressed This Visit       Unprioritized   Recurrent Clostridioides difficile diarrhea - Primary    Alyssa Howard has had recurrent cdiff infections since October of this year when she first tested positive after a prolonged course of cefdinir + flagyl for retropharyngeal abscess. She has completed two 10-day courses of higher dose vancomycin TID with a modified taper every other day for a short time frame. When she is on PO vancomycin she does have a complete resolution of symptoms. Attempt at difficid was not successful d/t cost and  no insurance coverage from what they are telling me. I think at this point with 3rd recurrence she would benefit from PO FMT with Vowst.  Will look into PA / assistance for this. 2nd option we could consider is zinplava infusion given she is toxin producing and lastly if no other options are available we can do a pulsed prolonged vancomycin taper over many weeks.  I see no reason to not use probiotics though no clear evidence that it will help.  Further timing of follow up and tx pending what's available and financially accessible.       Relevant Medications   vancomycin (VANCOCIN) 250 MG capsule   Fecal Microb Spores, Live-brpk (VOWST) CAPS    Janene Madeira, MSN, NP-C [facility] [place] Pager: [phone] Office: [phone]  07/04/22  4:50 PM

## 2022-07-04 ENCOUNTER — Other Ambulatory Visit: Payer: Self-pay

## 2022-07-04 ENCOUNTER — Encounter: Payer: Self-pay | Admitting: Infectious Diseases

## 2022-07-04 ENCOUNTER — Ambulatory Visit (INDEPENDENT_AMBULATORY_CARE_PROVIDER_SITE_OTHER): Payer: Medicare Other | Admitting: Infectious Diseases

## 2022-07-04 VITALS — BP 133/83 | HR 96 | Temp 96.9°F

## 2022-07-04 DIAGNOSIS — A0471 Enterocolitis due to Clostridium difficile, recurrent: Secondary | ICD-10-CM | POA: Diagnosis not present

## 2022-07-04 MED ORDER — VOWST PO CAPS: 4.0000 | ORAL_CAPSULE | Freq: Every day | ORAL | 0 refills | Status: AC

## 2022-07-04 NOTE — Patient Instructions (Addendum)
[phone] is our office number. Choose the triage option.   Plan will be to start the vancomycin capsules 4 times a day for now.   First option I will look into is the VOWST (pill fecal microbiota pill that will be taken after you finish the vancomycin)  Second, ZINPLAVA infusion   Third, will arrange a prolonged (many weeks) vancomycin taper regimen.   OK to continue the probiotics if you would like - data is hit or miss on the effect of them but I see no reason not to use them.

## 2022-07-04 NOTE — Assessment & Plan Note (Signed)
Alyssa Howard has had recurrent cdiff infections since October of this year when she first tested positive after a prolonged course of cefdinir + flagyl for retropharyngeal abscess. She has completed two 10-day courses of higher dose vancomycin TID with a modified taper every other day for a short time frame. When she is on PO vancomycin she does have a complete resolution of symptoms. Attempt at difficid was not successful d/t cost and no insurance coverage from what they are telling me. I think at this point with 3rd recurrence she would benefit from PO FMT with Vowst.  Will look into PA / assistance for this. 2nd option we could consider is zinplava infusion given she is toxin producing and lastly if no other options are available we can do a pulsed prolonged vancomycin taper over many weeks.  I see no reason to not use probiotics though no clear evidence that it will help.  Further timing of follow up and tx pending what's available and financially accessible.

## 2022-07-05 ENCOUNTER — Telehealth: Payer: Self-pay

## 2022-07-05 ENCOUNTER — Other Ambulatory Visit (HOSPITAL_COMMUNITY): Payer: Self-pay

## 2022-07-05 NOTE — Telephone Encounter (Signed)
[facility] Patient Advocate Encounter   Received notification from [facility] that prior authorization for Vowst is required.   PA submitted on 07/05/22 Key [id number] Status is pending    [facility] will continue to follow.   Ileene Patrick, [facility] Patient [facility] for Infectious Disease Phone: [phone] Fax:  [phone]

## 2022-07-05 NOTE — Telephone Encounter (Signed)
[facility] Patient Advocate Encounter  Prior Authorization for Vowst has been approved.    PA# [id number] Effective dates: 06/24/22 through 06/24/23  Patients co-pay is $3,881.06.   [facility] will continue to follow.  Ileene Patrick, [facility] Patient [facility] for Infectious Disease Phone: [phone] Fax:  [phone]

## 2022-07-08 ENCOUNTER — Telehealth: Payer: Self-pay

## 2022-07-08 NOTE — Telephone Encounter (Signed)
Received fax from [facility] stating they are unable to fill VOWST.  Per fax will need to cotnact VOWST [place] support service at [phone].  Called support service for additional information and spoke with representative who stated that we can either send rx to [place] [phone] or [place] [phone]. they also a have enrollment form you can fill out and fax back to VOWST support they would help get medication to patient and help with any financial assistance if needed.  Updated provider. Will forward message to pharmacy team for follow up.  [email or web address]  Leatrice Jewels, RMA

## 2022-07-09 ENCOUNTER — Other Ambulatory Visit (HOSPITAL_COMMUNITY): Payer: Self-pay

## 2022-07-10 DIAGNOSIS — R2681 Unsteadiness on feet: Secondary | ICD-10-CM | POA: Diagnosis not present

## 2022-07-10 DIAGNOSIS — M8000XD Age-related osteoporosis with current pathological fracture, unspecified site, subsequent encounter for fracture with routine healing: Secondary | ICD-10-CM | POA: Diagnosis not present

## 2022-07-10 DIAGNOSIS — I10 Essential (primary) hypertension: Secondary | ICD-10-CM | POA: Diagnosis not present

## 2022-07-10 DIAGNOSIS — J439 Emphysema, unspecified: Secondary | ICD-10-CM | POA: Diagnosis not present

## 2022-07-10 DIAGNOSIS — E559 Vitamin D deficiency, unspecified: Secondary | ICD-10-CM | POA: Diagnosis not present

## 2022-07-10 DIAGNOSIS — J36 Peritonsillar abscess: Secondary | ICD-10-CM | POA: Diagnosis not present

## 2022-07-11 ENCOUNTER — Telehealth: Payer: Self-pay

## 2022-07-11 NOTE — Telephone Encounter (Signed)
[facility] Patient Advocate Encounter  Completed and sent [place] application for Vowst for this patient who is insured.    Patient assistance phone number for follow up is [phone].   This encounter will be updated until final determination.   Ileene Patrick, [facility] Patient [facility] for Infectious Disease Phone: [phone] Fax:  [phone]

## 2022-07-12 DIAGNOSIS — J439 Emphysema, unspecified: Secondary | ICD-10-CM | POA: Diagnosis not present

## 2022-07-12 DIAGNOSIS — J36 Peritonsillar abscess: Secondary | ICD-10-CM | POA: Diagnosis not present

## 2022-07-12 DIAGNOSIS — R2681 Unsteadiness on feet: Secondary | ICD-10-CM | POA: Diagnosis not present

## 2022-07-12 DIAGNOSIS — E559 Vitamin D deficiency, unspecified: Secondary | ICD-10-CM | POA: Diagnosis not present

## 2022-07-12 DIAGNOSIS — M8000XD Age-related osteoporosis with current pathological fracture, unspecified site, subsequent encounter for fracture with routine healing: Secondary | ICD-10-CM | POA: Diagnosis not present

## 2022-07-12 DIAGNOSIS — I10 Essential (primary) hypertension: Secondary | ICD-10-CM | POA: Diagnosis not present

## 2022-07-14 DIAGNOSIS — M542 Cervicalgia: Secondary | ICD-10-CM | POA: Diagnosis not present

## 2022-07-14 DIAGNOSIS — Z87891 Personal history of nicotine dependence: Secondary | ICD-10-CM | POA: Diagnosis not present

## 2022-07-14 DIAGNOSIS — M8580 Other specified disorders of bone density and structure, unspecified site: Secondary | ICD-10-CM | POA: Diagnosis not present

## 2022-07-14 DIAGNOSIS — R2681 Unsteadiness on feet: Secondary | ICD-10-CM | POA: Diagnosis not present

## 2022-07-14 DIAGNOSIS — E559 Vitamin D deficiency, unspecified: Secondary | ICD-10-CM | POA: Diagnosis not present

## 2022-07-14 DIAGNOSIS — I10 Essential (primary) hypertension: Secondary | ICD-10-CM | POA: Diagnosis not present

## 2022-07-14 DIAGNOSIS — G542 Cervical root disorders, not elsewhere classified: Secondary | ICD-10-CM | POA: Diagnosis not present

## 2022-07-14 DIAGNOSIS — M40209 Unspecified kyphosis, site unspecified: Secondary | ICD-10-CM | POA: Diagnosis not present

## 2022-07-14 DIAGNOSIS — M8000XD Age-related osteoporosis with current pathological fracture, unspecified site, subsequent encounter for fracture with routine healing: Secondary | ICD-10-CM | POA: Diagnosis not present

## 2022-07-14 DIAGNOSIS — J439 Emphysema, unspecified: Secondary | ICD-10-CM | POA: Diagnosis not present

## 2022-07-15 ENCOUNTER — Other Ambulatory Visit (HOSPITAL_COMMUNITY): Payer: Self-pay

## 2022-07-16 ENCOUNTER — Telehealth: Payer: Self-pay | Admitting: Pharmacist

## 2022-07-16 NOTE — Telephone Encounter (Signed)
Attempted to reach [facility] infusion center regarding whether they would be able to administer Zinplava, and if so, what the cost would be for this patient. I was unable to get anyone on the phone at this time. Will attempt to reach them again at a later date.   Adria Dill, PharmD PGY-2 Infectious Diseases Resident  07/16/2022 4:22 PM

## 2022-07-16 NOTE — Telephone Encounter (Signed)
Attempted to call patient regarding coverage of Vowst and potential to trial Zinplava, but got continuous dial tone. Will attempt to reach back out at a later time.   Adria Dill, PharmD PGY-2 Infectious Diseases Resident  07/16/2022 3:50 PM

## 2022-07-17 ENCOUNTER — Other Ambulatory Visit (HOSPITAL_COMMUNITY): Payer: Self-pay

## 2022-07-17 NOTE — Telephone Encounter (Signed)
Attempted to call patient regarding plan for Zinplava. Not able to reach her at this time.  Will need to ask patient if she has had this antibiotic before and what day works best for her to get infusion.  Will try calling again. Alyssa Howard, RMA

## 2022-07-17 NOTE — Telephone Encounter (Signed)
Attempted to call patient again without success. Not able to find alternative contact information in patient chart.  Will reach out to PCP to see if they have alternative contact information.  Leatrice Jewels, RMA

## 2022-07-17 NOTE — Telephone Encounter (Signed)
Patient's husband called office back. States that he is okay with scheduling appointment for infusion tomorrow or Friday. Only has four days left of vancomycin.  Will fax orders to short stay today and will call tomorrow to schedule appointment.  Will updated him with appointment information once scheduled. Understands office will call tomorrow morning. Will keep eye out for call.  Leatrice Jewels, RMA

## 2022-07-17 NOTE — Telephone Encounter (Signed)
[facility] kindly helped with PA investigation for Zinplava x 1  Medicare will cover 80% and BC[employer] secondary to pick up the other 20% so no cost to the patient for drug.   (medicare: phone [phone])   Will work with [facility] Short Stay to schedule infusion appt. She is likely going to run out of vancomycin this week - if we need to extend it out will send in 125 mg caps.

## 2022-07-17 NOTE — Telephone Encounter (Signed)
Yay!

## 2022-07-18 NOTE — Telephone Encounter (Signed)
Left voicemail with short stay requesting call back to schedule infusion. Per patient's husband best days for infusion would be today 1/25, Monday-Wednesday next week. Leatrice Jewels, RMA

## 2022-07-18 NOTE — Telephone Encounter (Signed)
Patient scheduled for infusion on 1/31 at 10 AM at [facility] short stay. Will need to check in at admissions 15 minutes before appt.  Spoke with Sonia Side (husband) and relayed appointment information.  Per Doren Custard, NP patient can drop vancomycin 4 caps to 2 cabs a day. Verbalized understanding.  Leatrice Jewels, RMA

## 2022-07-19 ENCOUNTER — Telehealth: Payer: Self-pay | Admitting: Infectious Diseases

## 2022-07-19 ENCOUNTER — Other Ambulatory Visit (HOSPITAL_COMMUNITY): Payer: Self-pay

## 2022-07-19 MED ORDER — VOWST PO CAPS: 4.0000 | ORAL_CAPSULE | Freq: Every day | ORAL | 0 refills | Status: DC

## 2022-07-19 NOTE — Telephone Encounter (Addendum)
Alyssa Howard is not feeling very well - has been on PO vanc QID up until yesterday when we dropped to BID  Stool looks very normal, no diarrhea the last 1 week.   Sonia Side and Nada Boozer (son) have been discussing treatment - they want to proceed with the Vowst treatment.    Until we can secure the kit, will continue vancomycin BID through next week (they have enough through Wednesday)  Will then start bowel prep day 2 after stopping vancomycin and start vowst 4 caps before breakfast daily x 3 days.   Will cancel IV infusion appt for zinplava 1/31.   [facility] # [phone] fax # [phone]  [facility] # [phone] fax # [phone]    I cannot find either of these specific stores in the current pharmacy list - Will coordinate with pharmacy to help with sending to specialty pharmacies to obtain this for them.

## 2022-07-22 ENCOUNTER — Other Ambulatory Visit: Payer: Self-pay | Admitting: Pharmacist

## 2022-07-22 ENCOUNTER — Other Ambulatory Visit (HOSPITAL_COMMUNITY): Payer: Self-pay

## 2022-07-22 MED ORDER — VOWST PO CAPS: 4.0000 | ORAL_CAPSULE | Freq: Every day | ORAL | 0 refills | Status: AC

## 2022-07-22 NOTE — Telephone Encounter (Signed)
Called Short stay and canceled appointment for infusion.  Leatrice Jewels, RMA

## 2022-07-24 ENCOUNTER — Encounter (HOSPITAL_COMMUNITY): Payer: Medicare Other

## 2022-07-24 ENCOUNTER — Telehealth: Payer: Self-pay

## 2022-07-24 DIAGNOSIS — I10 Essential (primary) hypertension: Secondary | ICD-10-CM | POA: Diagnosis not present

## 2022-07-24 DIAGNOSIS — E559 Vitamin D deficiency, unspecified: Secondary | ICD-10-CM | POA: Diagnosis not present

## 2022-07-24 DIAGNOSIS — M8000XD Age-related osteoporosis with current pathological fracture, unspecified site, subsequent encounter for fracture with routine healing: Secondary | ICD-10-CM | POA: Diagnosis not present

## 2022-07-24 DIAGNOSIS — R2681 Unsteadiness on feet: Secondary | ICD-10-CM | POA: Diagnosis not present

## 2022-07-24 DIAGNOSIS — J439 Emphysema, unspecified: Secondary | ICD-10-CM | POA: Diagnosis not present

## 2022-07-24 DIAGNOSIS — G542 Cervical root disorders, not elsewhere classified: Secondary | ICD-10-CM | POA: Diagnosis not present

## 2022-07-24 NOTE — Telephone Encounter (Signed)
Patient husband called to let you know patient will take last tablet of Vancomycin- tomorrow,  Miralax- Friday  Vowst- Staturday He wanted to make sure that you are ok with this and patient is not starting Vowst to soon.  Please advise.  Alyssa Howard T Brooks Sailors

## 2022-07-24 NOTE — Telephone Encounter (Signed)
Error

## 2022-07-24 NOTE — Telephone Encounter (Signed)
Spoke to patient's husband and gave him all orders for the patient per [facility]. Patient scheduled for 4 week follow up. Alyssa Howard T Brooks Sailors

## 2022-07-30 ENCOUNTER — Telehealth: Payer: Self-pay

## 2022-07-30 NOTE — Telephone Encounter (Signed)
Relayed message below to patient's husband, he verbalized understanding and had no further questions.

## 2022-07-30 NOTE — Telephone Encounter (Signed)
Husband called stating that patient took last dose of Vowst today and she had a mushy soft stool early this morning. He wanted to know if this is normal or is it something he should be worried about concerning c-diff.

## 2022-08-13 DIAGNOSIS — E559 Vitamin D deficiency, unspecified: Secondary | ICD-10-CM | POA: Diagnosis not present

## 2022-08-13 DIAGNOSIS — M40209 Unspecified kyphosis, site unspecified: Secondary | ICD-10-CM | POA: Diagnosis not present

## 2022-08-13 DIAGNOSIS — J439 Emphysema, unspecified: Secondary | ICD-10-CM | POA: Diagnosis not present

## 2022-08-13 DIAGNOSIS — M8000XD Age-related osteoporosis with current pathological fracture, unspecified site, subsequent encounter for fracture with routine healing: Secondary | ICD-10-CM | POA: Diagnosis not present

## 2022-08-13 DIAGNOSIS — G542 Cervical root disorders, not elsewhere classified: Secondary | ICD-10-CM | POA: Diagnosis not present

## 2022-08-13 DIAGNOSIS — M542 Cervicalgia: Secondary | ICD-10-CM | POA: Diagnosis not present

## 2022-08-13 DIAGNOSIS — I10 Essential (primary) hypertension: Secondary | ICD-10-CM | POA: Diagnosis not present

## 2022-08-13 DIAGNOSIS — R2681 Unsteadiness on feet: Secondary | ICD-10-CM | POA: Diagnosis not present

## 2022-08-13 DIAGNOSIS — M8580 Other specified disorders of bone density and structure, unspecified site: Secondary | ICD-10-CM | POA: Diagnosis not present

## 2022-08-13 DIAGNOSIS — Z87891 Personal history of nicotine dependence: Secondary | ICD-10-CM | POA: Diagnosis not present

## 2022-08-15 DIAGNOSIS — F05 Delirium due to known physiological condition: Secondary | ICD-10-CM | POA: Diagnosis not present

## 2022-08-15 DIAGNOSIS — A0472 Enterocolitis due to Clostridium difficile, not specified as recurrent: Secondary | ICD-10-CM | POA: Diagnosis not present

## 2022-08-15 DIAGNOSIS — G4730 Sleep apnea, unspecified: Secondary | ICD-10-CM | POA: Diagnosis not present

## 2022-08-15 DIAGNOSIS — E440 Moderate protein-calorie malnutrition: Secondary | ICD-10-CM | POA: Diagnosis not present

## 2022-08-15 DIAGNOSIS — I10 Essential (primary) hypertension: Secondary | ICD-10-CM | POA: Diagnosis not present

## 2022-08-15 DIAGNOSIS — L89150 Pressure ulcer of sacral region, unstageable: Secondary | ICD-10-CM | POA: Diagnosis not present

## 2022-08-15 DIAGNOSIS — J439 Emphysema, unspecified: Secondary | ICD-10-CM | POA: Diagnosis not present

## 2022-08-15 DIAGNOSIS — R2681 Unsteadiness on feet: Secondary | ICD-10-CM | POA: Diagnosis not present

## 2022-08-26 ENCOUNTER — Ambulatory Visit: Payer: Medicare Other | Admitting: Infectious Diseases

## 2022-08-26 ENCOUNTER — Telehealth: Payer: Self-pay

## 2022-08-26 NOTE — Telephone Encounter (Signed)
Left voicemail asking patient husband Mr.Pottinger to return my call.  [place], CMA

## 2022-08-26 NOTE — Telephone Encounter (Signed)
Patient husband Mr.Moyers called stating that he had to cancel patient appointment today because patient is sick for a different reason (vomiting). Mr.Breshears stated that patient has improved since taking the medication that you recommended and he wanted to thank you. I advised him I would relay the message. Does patient need to reschedule follow up appointment?   [place], CMA

## 2022-08-26 NOTE — Telephone Encounter (Signed)
Received call from patient's husband, relayed Stephanie's message to him. Sonia Side has no further questions.   Beryle Flock, RN

## 2022-09-11 DIAGNOSIS — G542 Cervical root disorders, not elsewhere classified: Secondary | ICD-10-CM | POA: Diagnosis not present

## 2022-09-11 DIAGNOSIS — E559 Vitamin D deficiency, unspecified: Secondary | ICD-10-CM | POA: Diagnosis not present

## 2022-09-11 DIAGNOSIS — M8000XD Age-related osteoporosis with current pathological fracture, unspecified site, subsequent encounter for fracture with routine healing: Secondary | ICD-10-CM | POA: Diagnosis not present

## 2022-09-11 DIAGNOSIS — J439 Emphysema, unspecified: Secondary | ICD-10-CM | POA: Diagnosis not present

## 2022-09-11 DIAGNOSIS — I10 Essential (primary) hypertension: Secondary | ICD-10-CM | POA: Diagnosis not present

## 2022-09-11 DIAGNOSIS — R2681 Unsteadiness on feet: Secondary | ICD-10-CM | POA: Diagnosis not present

## 2022-09-12 DIAGNOSIS — G542 Cervical root disorders, not elsewhere classified: Secondary | ICD-10-CM | POA: Diagnosis not present

## 2022-09-12 DIAGNOSIS — J439 Emphysema, unspecified: Secondary | ICD-10-CM | POA: Diagnosis not present

## 2022-09-12 DIAGNOSIS — R2681 Unsteadiness on feet: Secondary | ICD-10-CM | POA: Diagnosis not present

## 2022-09-12 DIAGNOSIS — M8000XD Age-related osteoporosis with current pathological fracture, unspecified site, subsequent encounter for fracture with routine healing: Secondary | ICD-10-CM | POA: Diagnosis not present

## 2022-09-12 DIAGNOSIS — M542 Cervicalgia: Secondary | ICD-10-CM | POA: Diagnosis not present

## 2022-09-12 DIAGNOSIS — E559 Vitamin D deficiency, unspecified: Secondary | ICD-10-CM | POA: Diagnosis not present

## 2022-09-12 DIAGNOSIS — M8580 Other specified disorders of bone density and structure, unspecified site: Secondary | ICD-10-CM | POA: Diagnosis not present

## 2022-09-12 DIAGNOSIS — I10 Essential (primary) hypertension: Secondary | ICD-10-CM | POA: Diagnosis not present

## 2022-09-12 DIAGNOSIS — Z87891 Personal history of nicotine dependence: Secondary | ICD-10-CM | POA: Diagnosis not present

## 2022-09-12 DIAGNOSIS — M40209 Unspecified kyphosis, site unspecified: Secondary | ICD-10-CM | POA: Diagnosis not present

## 2022-09-18 DIAGNOSIS — M8000XD Age-related osteoporosis with current pathological fracture, unspecified site, subsequent encounter for fracture with routine healing: Secondary | ICD-10-CM | POA: Diagnosis not present

## 2022-09-18 DIAGNOSIS — E559 Vitamin D deficiency, unspecified: Secondary | ICD-10-CM | POA: Diagnosis not present

## 2022-09-18 DIAGNOSIS — I10 Essential (primary) hypertension: Secondary | ICD-10-CM | POA: Diagnosis not present

## 2022-09-18 DIAGNOSIS — G542 Cervical root disorders, not elsewhere classified: Secondary | ICD-10-CM | POA: Diagnosis not present

## 2022-09-18 DIAGNOSIS — J439 Emphysema, unspecified: Secondary | ICD-10-CM | POA: Diagnosis not present

## 2022-09-18 DIAGNOSIS — R2681 Unsteadiness on feet: Secondary | ICD-10-CM | POA: Diagnosis not present

## 2022-09-23 DIAGNOSIS — R2681 Unsteadiness on feet: Secondary | ICD-10-CM | POA: Diagnosis not present

## 2022-09-23 DIAGNOSIS — I10 Essential (primary) hypertension: Secondary | ICD-10-CM | POA: Diagnosis not present

## 2022-09-23 DIAGNOSIS — E559 Vitamin D deficiency, unspecified: Secondary | ICD-10-CM | POA: Diagnosis not present

## 2022-09-23 DIAGNOSIS — G542 Cervical root disorders, not elsewhere classified: Secondary | ICD-10-CM | POA: Diagnosis not present

## 2022-09-23 DIAGNOSIS — M8000XD Age-related osteoporosis with current pathological fracture, unspecified site, subsequent encounter for fracture with routine healing: Secondary | ICD-10-CM | POA: Diagnosis not present

## 2022-09-23 DIAGNOSIS — J439 Emphysema, unspecified: Secondary | ICD-10-CM | POA: Diagnosis not present

## 2022-10-02 DIAGNOSIS — J439 Emphysema, unspecified: Secondary | ICD-10-CM | POA: Diagnosis not present

## 2022-10-02 DIAGNOSIS — E559 Vitamin D deficiency, unspecified: Secondary | ICD-10-CM | POA: Diagnosis not present

## 2022-10-02 DIAGNOSIS — R2681 Unsteadiness on feet: Secondary | ICD-10-CM | POA: Diagnosis not present

## 2022-10-02 DIAGNOSIS — G542 Cervical root disorders, not elsewhere classified: Secondary | ICD-10-CM | POA: Diagnosis not present

## 2022-10-02 DIAGNOSIS — M8000XD Age-related osteoporosis with current pathological fracture, unspecified site, subsequent encounter for fracture with routine healing: Secondary | ICD-10-CM | POA: Diagnosis not present

## 2022-10-02 DIAGNOSIS — I10 Essential (primary) hypertension: Secondary | ICD-10-CM | POA: Diagnosis not present

## 2022-10-07 DIAGNOSIS — J439 Emphysema, unspecified: Secondary | ICD-10-CM | POA: Diagnosis not present

## 2022-10-07 DIAGNOSIS — I10 Essential (primary) hypertension: Secondary | ICD-10-CM | POA: Diagnosis not present

## 2022-10-07 DIAGNOSIS — G542 Cervical root disorders, not elsewhere classified: Secondary | ICD-10-CM | POA: Diagnosis not present

## 2022-10-07 DIAGNOSIS — R2681 Unsteadiness on feet: Secondary | ICD-10-CM | POA: Diagnosis not present

## 2022-10-07 DIAGNOSIS — E559 Vitamin D deficiency, unspecified: Secondary | ICD-10-CM | POA: Diagnosis not present

## 2022-10-07 DIAGNOSIS — M8000XD Age-related osteoporosis with current pathological fracture, unspecified site, subsequent encounter for fracture with routine healing: Secondary | ICD-10-CM | POA: Diagnosis not present

## 2022-10-10 DIAGNOSIS — M8000XD Age-related osteoporosis with current pathological fracture, unspecified site, subsequent encounter for fracture with routine healing: Secondary | ICD-10-CM | POA: Diagnosis not present

## 2022-10-10 DIAGNOSIS — E559 Vitamin D deficiency, unspecified: Secondary | ICD-10-CM | POA: Diagnosis not present

## 2022-10-10 DIAGNOSIS — G542 Cervical root disorders, not elsewhere classified: Secondary | ICD-10-CM | POA: Diagnosis not present

## 2022-10-10 DIAGNOSIS — J439 Emphysema, unspecified: Secondary | ICD-10-CM | POA: Diagnosis not present

## 2022-10-10 DIAGNOSIS — R2681 Unsteadiness on feet: Secondary | ICD-10-CM | POA: Diagnosis not present

## 2022-10-10 DIAGNOSIS — I10 Essential (primary) hypertension: Secondary | ICD-10-CM | POA: Diagnosis not present

## 2022-10-12 DIAGNOSIS — Z87891 Personal history of nicotine dependence: Secondary | ICD-10-CM | POA: Diagnosis not present

## 2022-10-12 DIAGNOSIS — J439 Emphysema, unspecified: Secondary | ICD-10-CM | POA: Diagnosis not present

## 2022-10-12 DIAGNOSIS — G542 Cervical root disorders, not elsewhere classified: Secondary | ICD-10-CM | POA: Diagnosis not present

## 2022-10-12 DIAGNOSIS — I10 Essential (primary) hypertension: Secondary | ICD-10-CM | POA: Diagnosis not present

## 2022-10-12 DIAGNOSIS — M8000XD Age-related osteoporosis with current pathological fracture, unspecified site, subsequent encounter for fracture with routine healing: Secondary | ICD-10-CM | POA: Diagnosis not present

## 2022-10-12 DIAGNOSIS — M40209 Unspecified kyphosis, site unspecified: Secondary | ICD-10-CM | POA: Diagnosis not present

## 2022-10-12 DIAGNOSIS — M542 Cervicalgia: Secondary | ICD-10-CM | POA: Diagnosis not present

## 2022-10-12 DIAGNOSIS — E559 Vitamin D deficiency, unspecified: Secondary | ICD-10-CM | POA: Diagnosis not present

## 2022-10-12 DIAGNOSIS — R2681 Unsteadiness on feet: Secondary | ICD-10-CM | POA: Diagnosis not present

## 2022-10-12 DIAGNOSIS — M8580 Other specified disorders of bone density and structure, unspecified site: Secondary | ICD-10-CM | POA: Diagnosis not present

## 2022-10-15 DIAGNOSIS — I10 Essential (primary) hypertension: Secondary | ICD-10-CM | POA: Diagnosis not present

## 2022-10-15 DIAGNOSIS — G542 Cervical root disorders, not elsewhere classified: Secondary | ICD-10-CM | POA: Diagnosis not present

## 2022-10-15 DIAGNOSIS — R2681 Unsteadiness on feet: Secondary | ICD-10-CM | POA: Diagnosis not present

## 2022-10-15 DIAGNOSIS — E559 Vitamin D deficiency, unspecified: Secondary | ICD-10-CM | POA: Diagnosis not present

## 2022-10-15 DIAGNOSIS — J439 Emphysema, unspecified: Secondary | ICD-10-CM | POA: Diagnosis not present

## 2022-10-15 DIAGNOSIS — M8000XD Age-related osteoporosis with current pathological fracture, unspecified site, subsequent encounter for fracture with routine healing: Secondary | ICD-10-CM | POA: Diagnosis not present

## 2022-10-17 DIAGNOSIS — E559 Vitamin D deficiency, unspecified: Secondary | ICD-10-CM | POA: Diagnosis not present

## 2022-10-17 DIAGNOSIS — J439 Emphysema, unspecified: Secondary | ICD-10-CM | POA: Diagnosis not present

## 2022-10-17 DIAGNOSIS — G542 Cervical root disorders, not elsewhere classified: Secondary | ICD-10-CM | POA: Diagnosis not present

## 2022-10-17 DIAGNOSIS — R2681 Unsteadiness on feet: Secondary | ICD-10-CM | POA: Diagnosis not present

## 2022-10-17 DIAGNOSIS — M8000XD Age-related osteoporosis with current pathological fracture, unspecified site, subsequent encounter for fracture with routine healing: Secondary | ICD-10-CM | POA: Diagnosis not present

## 2022-10-17 DIAGNOSIS — I10 Essential (primary) hypertension: Secondary | ICD-10-CM | POA: Diagnosis not present

## 2022-10-22 DIAGNOSIS — M8000XD Age-related osteoporosis with current pathological fracture, unspecified site, subsequent encounter for fracture with routine healing: Secondary | ICD-10-CM | POA: Diagnosis not present

## 2022-10-22 DIAGNOSIS — R2681 Unsteadiness on feet: Secondary | ICD-10-CM | POA: Diagnosis not present

## 2022-10-22 DIAGNOSIS — E559 Vitamin D deficiency, unspecified: Secondary | ICD-10-CM | POA: Diagnosis not present

## 2022-10-22 DIAGNOSIS — J439 Emphysema, unspecified: Secondary | ICD-10-CM | POA: Diagnosis not present

## 2022-10-22 DIAGNOSIS — I10 Essential (primary) hypertension: Secondary | ICD-10-CM | POA: Diagnosis not present

## 2022-10-22 DIAGNOSIS — G542 Cervical root disorders, not elsewhere classified: Secondary | ICD-10-CM | POA: Diagnosis not present

## 2022-10-24 DIAGNOSIS — E559 Vitamin D deficiency, unspecified: Secondary | ICD-10-CM | POA: Diagnosis not present

## 2022-10-24 DIAGNOSIS — J439 Emphysema, unspecified: Secondary | ICD-10-CM | POA: Diagnosis not present

## 2022-10-24 DIAGNOSIS — R2681 Unsteadiness on feet: Secondary | ICD-10-CM | POA: Diagnosis not present

## 2022-10-24 DIAGNOSIS — G542 Cervical root disorders, not elsewhere classified: Secondary | ICD-10-CM | POA: Diagnosis not present

## 2022-10-24 DIAGNOSIS — I10 Essential (primary) hypertension: Secondary | ICD-10-CM | POA: Diagnosis not present

## 2022-10-24 DIAGNOSIS — M8000XD Age-related osteoporosis with current pathological fracture, unspecified site, subsequent encounter for fracture with routine healing: Secondary | ICD-10-CM | POA: Diagnosis not present

## 2022-10-29 DIAGNOSIS — E559 Vitamin D deficiency, unspecified: Secondary | ICD-10-CM | POA: Diagnosis not present

## 2022-10-29 DIAGNOSIS — J439 Emphysema, unspecified: Secondary | ICD-10-CM | POA: Diagnosis not present

## 2022-10-29 DIAGNOSIS — R2681 Unsteadiness on feet: Secondary | ICD-10-CM | POA: Diagnosis not present

## 2022-10-29 DIAGNOSIS — I10 Essential (primary) hypertension: Secondary | ICD-10-CM | POA: Diagnosis not present

## 2022-10-29 DIAGNOSIS — M8000XD Age-related osteoporosis with current pathological fracture, unspecified site, subsequent encounter for fracture with routine healing: Secondary | ICD-10-CM | POA: Diagnosis not present

## 2022-10-29 DIAGNOSIS — G542 Cervical root disorders, not elsewhere classified: Secondary | ICD-10-CM | POA: Diagnosis not present

## 2022-10-31 DIAGNOSIS — I10 Essential (primary) hypertension: Secondary | ICD-10-CM | POA: Diagnosis not present

## 2022-10-31 DIAGNOSIS — R2681 Unsteadiness on feet: Secondary | ICD-10-CM | POA: Diagnosis not present

## 2022-10-31 DIAGNOSIS — G542 Cervical root disorders, not elsewhere classified: Secondary | ICD-10-CM | POA: Diagnosis not present

## 2022-10-31 DIAGNOSIS — M8000XD Age-related osteoporosis with current pathological fracture, unspecified site, subsequent encounter for fracture with routine healing: Secondary | ICD-10-CM | POA: Diagnosis not present

## 2022-10-31 DIAGNOSIS — J439 Emphysema, unspecified: Secondary | ICD-10-CM | POA: Diagnosis not present

## 2022-10-31 DIAGNOSIS — E559 Vitamin D deficiency, unspecified: Secondary | ICD-10-CM | POA: Diagnosis not present

## 2022-12-10 DIAGNOSIS — E559 Vitamin D deficiency, unspecified: Secondary | ICD-10-CM | POA: Diagnosis not present

## 2022-12-10 DIAGNOSIS — M810 Age-related osteoporosis without current pathological fracture: Secondary | ICD-10-CM | POA: Diagnosis not present

## 2022-12-10 DIAGNOSIS — E782 Mixed hyperlipidemia: Secondary | ICD-10-CM | POA: Diagnosis not present

## 2022-12-10 DIAGNOSIS — I10 Essential (primary) hypertension: Secondary | ICD-10-CM | POA: Diagnosis not present

## 2022-12-16 DIAGNOSIS — I10 Essential (primary) hypertension: Secondary | ICD-10-CM | POA: Diagnosis not present

## 2022-12-16 DIAGNOSIS — G8929 Other chronic pain: Secondary | ICD-10-CM | POA: Diagnosis not present

## 2022-12-16 DIAGNOSIS — Z0000 Encounter for general adult medical examination without abnormal findings: Secondary | ICD-10-CM | POA: Diagnosis not present

## 2022-12-16 DIAGNOSIS — Z1339 Encounter for screening examination for other mental health and behavioral disorders: Secondary | ICD-10-CM | POA: Diagnosis not present

## 2022-12-16 DIAGNOSIS — G4730 Sleep apnea, unspecified: Secondary | ICD-10-CM | POA: Diagnosis not present

## 2022-12-16 DIAGNOSIS — R82998 Other abnormal findings in urine: Secondary | ICD-10-CM | POA: Diagnosis not present

## 2022-12-16 DIAGNOSIS — M810 Age-related osteoporosis without current pathological fracture: Secondary | ICD-10-CM | POA: Diagnosis not present

## 2022-12-16 DIAGNOSIS — F05 Delirium due to known physiological condition: Secondary | ICD-10-CM | POA: Diagnosis not present

## 2022-12-16 DIAGNOSIS — Z1331 Encounter for screening for depression: Secondary | ICD-10-CM | POA: Diagnosis not present

## 2022-12-16 DIAGNOSIS — H903 Sensorineural hearing loss, bilateral: Secondary | ICD-10-CM | POA: Diagnosis not present

## 2022-12-16 DIAGNOSIS — R2681 Unsteadiness on feet: Secondary | ICD-10-CM | POA: Diagnosis not present

## 2022-12-16 DIAGNOSIS — E782 Mixed hyperlipidemia: Secondary | ICD-10-CM | POA: Diagnosis not present

## 2022-12-16 DIAGNOSIS — M25561 Pain in right knee: Secondary | ICD-10-CM | POA: Diagnosis not present

## 2022-12-29 ENCOUNTER — Emergency Department (HOSPITAL_COMMUNITY): Payer: Medicare Other

## 2022-12-29 ENCOUNTER — Encounter (HOSPITAL_COMMUNITY): Payer: Self-pay

## 2022-12-29 ENCOUNTER — Inpatient Hospital Stay (HOSPITAL_COMMUNITY)
Admission: EM | Admit: 2022-12-29 | Discharge: 2023-01-02 | DRG: 177 | Disposition: A | Discharge status: Home / Self Care | Payer: Medicare Other | Attending: Internal Medicine | Admitting: Internal Medicine

## 2022-12-29 ENCOUNTER — Other Ambulatory Visit: Payer: Self-pay

## 2022-12-29 DIAGNOSIS — Z833 Family history of diabetes mellitus: Secondary | ICD-10-CM

## 2022-12-29 DIAGNOSIS — G4733 Obstructive sleep apnea (adult) (pediatric): Secondary | ICD-10-CM | POA: Diagnosis not present

## 2022-12-29 DIAGNOSIS — H905 Unspecified sensorineural hearing loss: Secondary | ICD-10-CM | POA: Diagnosis present

## 2022-12-29 DIAGNOSIS — Z808 Family history of malignant neoplasm of other organs or systems: Secondary | ICD-10-CM

## 2022-12-29 DIAGNOSIS — Z8616 Personal history of COVID-19: Secondary | ICD-10-CM | POA: Diagnosis not present

## 2022-12-29 DIAGNOSIS — Z823 Family history of stroke: Secondary | ICD-10-CM | POA: Diagnosis not present

## 2022-12-29 DIAGNOSIS — R0902 Hypoxemia: Secondary | ICD-10-CM | POA: Diagnosis not present

## 2022-12-29 DIAGNOSIS — R4182 Altered mental status, unspecified: Secondary | ICD-10-CM | POA: Diagnosis not present

## 2022-12-29 DIAGNOSIS — G9341 Metabolic encephalopathy: Secondary | ICD-10-CM | POA: Diagnosis present

## 2022-12-29 DIAGNOSIS — M810 Age-related osteoporosis without current pathological fracture: Secondary | ICD-10-CM | POA: Diagnosis present

## 2022-12-29 DIAGNOSIS — J9811 Atelectasis: Secondary | ICD-10-CM | POA: Diagnosis not present

## 2022-12-29 DIAGNOSIS — E7800 Pure hypercholesterolemia, unspecified: Secondary | ICD-10-CM | POA: Diagnosis present

## 2022-12-29 DIAGNOSIS — G8929 Other chronic pain: Secondary | ICD-10-CM | POA: Diagnosis present

## 2022-12-29 DIAGNOSIS — R1084 Generalized abdominal pain: Secondary | ICD-10-CM | POA: Diagnosis not present

## 2022-12-29 DIAGNOSIS — Z993 Dependence on wheelchair: Secondary | ICD-10-CM

## 2022-12-29 DIAGNOSIS — Z8249 Family history of ischemic heart disease and other diseases of the circulatory system: Secondary | ICD-10-CM | POA: Diagnosis not present

## 2022-12-29 DIAGNOSIS — Z820 Family history of epilepsy and other diseases of the nervous system: Secondary | ICD-10-CM | POA: Diagnosis not present

## 2022-12-29 DIAGNOSIS — R739 Hyperglycemia, unspecified: Secondary | ICD-10-CM | POA: Diagnosis present

## 2022-12-29 DIAGNOSIS — E872 Acidosis, unspecified: Secondary | ICD-10-CM | POA: Diagnosis not present

## 2022-12-29 DIAGNOSIS — I10 Essential (primary) hypertension: Secondary | ICD-10-CM | POA: Diagnosis present

## 2022-12-29 DIAGNOSIS — R531 Weakness: Secondary | ICD-10-CM

## 2022-12-29 DIAGNOSIS — R2681 Unsteadiness on feet: Secondary | ICD-10-CM | POA: Diagnosis present

## 2022-12-29 DIAGNOSIS — Z87891 Personal history of nicotine dependence: Secondary | ICD-10-CM | POA: Diagnosis not present

## 2022-12-29 DIAGNOSIS — R509 Fever, unspecified: Secondary | ICD-10-CM | POA: Diagnosis not present

## 2022-12-29 DIAGNOSIS — J439 Emphysema, unspecified: Secondary | ICD-10-CM | POA: Diagnosis present

## 2022-12-29 DIAGNOSIS — U071 COVID-19: Principal | ICD-10-CM | POA: Diagnosis present

## 2022-12-29 DIAGNOSIS — F05 Delirium due to known physiological condition: Secondary | ICD-10-CM | POA: Diagnosis not present

## 2022-12-29 DIAGNOSIS — R918 Other nonspecific abnormal finding of lung field: Secondary | ICD-10-CM | POA: Diagnosis not present

## 2022-12-29 DIAGNOSIS — F03A0 Unspecified dementia, mild, without behavioral disturbance, psychotic disturbance, mood disturbance, and anxiety: Secondary | ICD-10-CM | POA: Diagnosis present

## 2022-12-29 DIAGNOSIS — R600 Localized edema: Secondary | ICD-10-CM | POA: Diagnosis present

## 2022-12-29 DIAGNOSIS — E860 Dehydration: Secondary | ICD-10-CM | POA: Diagnosis not present

## 2022-12-29 DIAGNOSIS — R000 Tachycardia, unspecified: Secondary | ICD-10-CM | POA: Diagnosis present

## 2022-12-29 DIAGNOSIS — M7989 Other specified soft tissue disorders: Secondary | ICD-10-CM | POA: Diagnosis not present

## 2022-12-29 DIAGNOSIS — E869 Volume depletion, unspecified: Secondary | ICD-10-CM | POA: Diagnosis present

## 2022-12-29 DIAGNOSIS — I491 Atrial premature depolarization: Secondary | ICD-10-CM | POA: Diagnosis not present

## 2022-12-29 DIAGNOSIS — J3489 Other specified disorders of nose and nasal sinuses: Secondary | ICD-10-CM

## 2022-12-29 DIAGNOSIS — K5900 Constipation, unspecified: Secondary | ICD-10-CM | POA: Diagnosis present

## 2022-12-29 DIAGNOSIS — R0602 Shortness of breath: Secondary | ICD-10-CM | POA: Diagnosis not present

## 2022-12-29 LAB — CBC WITH DIFFERENTIAL/PLATELET
Abs Immature Granulocytes: 0.05 10*3/uL (ref 0.00–0.07)
Basophils Absolute: 0 10*3/uL (ref 0.0–0.1)
Basophils Relative: 0 %
Eosinophils Absolute: 0 10*3/uL (ref 0.0–0.5)
Eosinophils Relative: 0 %
HCT: 45.2 % (ref 36.0–46.0)
Hemoglobin: 14.6 g/dL (ref 12.0–15.0)
Immature Granulocytes: 1 %
Lymphocytes Relative: 6 %
Lymphs Abs: 0.6 10*3/uL — ABNORMAL LOW (ref 0.7–4.0)
MCH: 31.1 pg (ref 26.0–34.0)
MCHC: 32.3 g/dL (ref 30.0–36.0)
MCV: 96.2 fL (ref 80.0–100.0)
Monocytes Absolute: 0.7 10*3/uL (ref 0.1–1.0)
Monocytes Relative: 7 %
Neutro Abs: 9.4 10*3/uL — ABNORMAL HIGH (ref 1.7–7.7)
Neutrophils Relative %: 86 %
Platelets: 349 10*3/uL (ref 150–400)
RBC: 4.7 MIL/uL (ref 3.87–5.11)
RDW: 12.3 % (ref 11.5–15.5)
WBC: 10.9 10*3/uL — ABNORMAL HIGH (ref 4.0–10.5)
nRBC: 0 % (ref 0.0–0.2)

## 2022-12-29 LAB — CULTURE, BLOOD (ROUTINE X 2)

## 2022-12-29 LAB — COMPREHENSIVE METABOLIC PANEL
ALT: 11 U/L (ref 0–44)
AST: 17 U/L (ref 15–41)
Albumin: 3.7 g/dL (ref 3.5–5.0)
Alkaline Phosphatase: 90 U/L (ref 38–126)
Anion gap: 12 (ref 5–15)
BUN: 21 mg/dL (ref 8–23)
CO2: 23 mmol/L (ref 22–32)
Calcium: 9.2 mg/dL (ref 8.9–10.3)
Chloride: 102 mmol/L (ref 98–111)
Creatinine, Ser: 0.82 mg/dL (ref 0.44–1.00)
GFR, Estimated: >60 mL/min (ref >60)
Glucose, Bld: 157 mg/dL — ABNORMAL HIGH (ref 70–99)
Potassium: 3.8 mmol/L (ref 3.5–5.1)
Sodium: 137 mmol/L (ref 135–145)
Total Bilirubin: 0.8 mg/dL (ref 0.3–1.2)
Total Protein: 7.6 g/dL (ref 6.5–8.1)

## 2022-12-29 LAB — LACTIC ACID, PLASMA
Lactic Acid, Venous: 2.5 mmol/L (ref 0.5–1.9)
Lactic Acid, Venous: 1.8 mmol/L (ref 0.5–1.9)

## 2022-12-29 LAB — SARS CORONAVIRUS 2 BY RT PCR: SARS Coronavirus 2 by RT PCR: POSITIVE — AB

## 2022-12-29 LAB — BRAIN NATRIURETIC PEPTIDE: B Natriuretic Peptide: 68.1 pg/mL (ref 0.0–100.0)

## 2022-12-29 LAB — TROPONIN I (HIGH SENSITIVITY)
Troponin I (High Sensitivity): 6 ng/L (ref <18)
Troponin I (High Sensitivity): 7 ng/L (ref <18)

## 2022-12-29 MED ORDER — ONDANSETRON HCL 4 MG/2ML IJ SOLN
4.0000 mg | Freq: Once | INTRAMUSCULAR | Status: AC
  Administered 2022-12-29: 4 mg via INTRAVENOUS
  Filled 2022-12-29: qty 2

## 2022-12-29 MED ORDER — MELATONIN 5 MG PO TABS
5.0000 mg | ORAL_TABLET | Freq: Every evening | ORAL | Status: DC | PRN
  Administered 2022-12-29 – 2023-01-01 (×3): 5 mg via ORAL
  Filled 2022-12-29 (×3): qty 1

## 2022-12-29 MED ORDER — VANCOMYCIN HCL IN DEXTROSE 1-5 GM/200ML-% IV SOLN
1000.0000 mg | INTRAVENOUS | Status: AC
  Administered 2022-12-29: 1000 mg via INTRAVENOUS
  Filled 2022-12-29: qty 200

## 2022-12-29 MED ORDER — IPRATROPIUM-ALBUTEROL 0.5-2.5 (3) MG/3ML IN SOLN
3.0000 mL | Freq: Four times a day (QID) | RESPIRATORY_TRACT | Status: DC
  Administered 2022-12-29: 3 mL via RESPIRATORY_TRACT
  Filled 2022-12-29: qty 3

## 2022-12-29 MED ORDER — CHOLECALCIFEROL 10 MCG (400 UNIT) PO TABS
400.0000 [IU] | ORAL_TABLET | Freq: Every day | ORAL | Status: DC
  Administered 2022-12-30 – 2023-01-02 (×4): 400 [IU] via ORAL
  Filled 2022-12-29 (×4): qty 1

## 2022-12-29 MED ORDER — POLYETHYLENE GLYCOL 3350 17 G PO PACK: 17.0000 g | PACK | Freq: Every day | ORAL | Status: DC | PRN

## 2022-12-29 MED ORDER — SODIUM CHLORIDE 0.9 % IV SOLN
200.0000 mg | Freq: Once | INTRAVENOUS | Status: AC
  Administered 2022-12-29: 200 mg via INTRAVENOUS
  Filled 2022-12-29: qty 40

## 2022-12-29 MED ORDER — ENOXAPARIN SODIUM 40 MG/0.4ML IJ SOSY
40.0000 mg | PREFILLED_SYRINGE | INTRAMUSCULAR | Status: DC
  Administered 2022-12-29: 40 mg via SUBCUTANEOUS
  Filled 2022-12-29: qty 0.4

## 2022-12-29 MED ORDER — BENZONATATE 100 MG PO CAPS
200.0000 mg | ORAL_CAPSULE | Freq: Three times a day (TID) | ORAL | Status: AC
  Administered 2022-12-29 – 2023-01-01 (×8): 200 mg via ORAL
  Filled 2022-12-29 (×8): qty 2

## 2022-12-29 MED ORDER — SODIUM CHLORIDE 0.9 % IV SOLN
1.0000 g | INTRAVENOUS | Status: DC
  Administered 2022-12-30: 1 g via INTRAVENOUS
  Filled 2022-12-29: qty 10

## 2022-12-29 MED ORDER — PREDNISONE 50 MG PO TABS
50.0000 mg | ORAL_TABLET | Freq: Every day | ORAL | Status: DC
  Administered 2023-01-02: 50 mg via ORAL
  Filled 2022-12-29: qty 1

## 2022-12-29 MED ORDER — FOLIC ACID 1 MG PO TABS
1.0000 mg | ORAL_TABLET | Freq: Every day | ORAL | Status: DC
  Administered 2022-12-29 – 2023-01-02 (×5): 1 mg via ORAL
  Filled 2022-12-29 (×5): qty 1

## 2022-12-29 MED ORDER — PROCHLORPERAZINE EDISYLATE 10 MG/2ML IJ SOLN
5.0000 mg | Freq: Four times a day (QID) | INTRAMUSCULAR | Status: DC | PRN
  Administered 2022-12-29: 5 mg via INTRAVENOUS
  Filled 2022-12-29: qty 2

## 2022-12-29 MED ORDER — ACETAMINOPHEN 325 MG PO TABS: 650.0000 mg | ORAL_TABLET | Freq: Four times a day (QID) | ORAL | Status: DC | PRN

## 2022-12-29 MED ORDER — SACCHAROMYCES BOULARDII 250 MG PO CAPS
250.0000 mg | ORAL_CAPSULE | Freq: Two times a day (BID) | ORAL | Status: DC
  Administered 2022-12-29 – 2023-01-02 (×8): 250 mg via ORAL
  Filled 2022-12-29 (×8): qty 1

## 2022-12-29 MED ORDER — ADULT MULTIVITAMIN W/MINERALS CH
1.0000 | ORAL_TABLET | Freq: Every day | ORAL | Status: DC
  Administered 2022-12-29 – 2023-01-02 (×5): 1 via ORAL
  Filled 2022-12-29 (×5): qty 1

## 2022-12-29 MED ORDER — SODIUM CHLORIDE 0.9 % IV BOLUS
1000.0000 mL | Freq: Once | INTRAVENOUS | Status: AC
  Administered 2022-12-29: 1000 mL via INTRAVENOUS

## 2022-12-29 MED ORDER — THIAMINE MONONITRATE 100 MG PO TABS
100.0000 mg | ORAL_TABLET | Freq: Every day | ORAL | Status: DC
  Administered 2022-12-29 – 2023-01-02 (×5): 100 mg via ORAL
  Filled 2022-12-29 (×5): qty 1

## 2022-12-29 MED ORDER — METHYLPREDNISOLONE SODIUM SUCC 40 MG IJ SOLR
0.5000 mg/kg | Freq: Two times a day (BID) | INTRAMUSCULAR | Status: AC
  Administered 2022-12-29 – 2023-01-01 (×6): 26 mg via INTRAVENOUS
  Filled 2022-12-29 (×6): qty 1

## 2022-12-29 MED ORDER — SODIUM CHLORIDE 0.9 % IV SOLN
2.0000 g | INTRAVENOUS | Status: AC
  Administered 2022-12-29: 2 g via INTRAVENOUS
  Filled 2022-12-29: qty 12.5

## 2022-12-29 MED ORDER — VANCOMYCIN HCL 125 MG PO CAPS
125.0000 mg | ORAL_CAPSULE | Freq: Every day | ORAL | Status: DC
  Administered 2022-12-29: 125 mg via ORAL
  Filled 2022-12-29: qty 1

## 2022-12-29 MED ORDER — ACETAMINOPHEN 500 MG PO TABS
1000.0000 mg | ORAL_TABLET | Freq: Once | ORAL | Status: AC
  Administered 2022-12-29: 1000 mg via ORAL
  Filled 2022-12-29: qty 2

## 2022-12-29 MED ORDER — SODIUM CHLORIDE 0.9 % IV SOLN
100.0000 mg | Freq: Every day | INTRAVENOUS | Status: AC
  Administered 2022-12-30 – 2022-12-31 (×2): 100 mg via INTRAVENOUS
  Filled 2022-12-29 (×3): qty 20

## 2022-12-29 MED ORDER — IPRATROPIUM-ALBUTEROL 0.5-2.5 (3) MG/3ML IN SOLN
3.0000 mL | Freq: Three times a day (TID) | RESPIRATORY_TRACT | Status: DC
  Administered 2022-12-30 – 2022-12-31 (×4): 3 mL via RESPIRATORY_TRACT
  Filled 2022-12-29 (×4): qty 3

## 2022-12-29 NOTE — ED Triage Notes (Signed)
BIB EMS from home for generalized weakness. Pt c/o sob and constipation and possible UTI.  Pt family concerned for increased confusion. Pt alert and oriented to self and place on arrival.

## 2022-12-29 NOTE — H&P (Addendum)
History and Physical  Alyssa Howard:[id number] DOB: 02/18/1941 DOA: 12/29/2022  Referring physician: Dr. Rodena Medin, EDP  PCP: Garlan Fillers, MD  Outpatient Specialists: None Patient coming from: Home  Chief Complaint: Altered mental status.  HPI: Alyssa Howard is a 82 y.o. female with medical history significant for hypertension, emphysema, former tobacco user, cervicalgia, kyphosis, history of osteoporosis with pathological fracture, history of treated peritonsillar abscess, history of C. difficile infection with recurrence 04/19/2022, 05/27/2022, 07/01/22, currently not taking any prescribed medications, who presents with worsening confusion for the past 3 days, waxing and waning at times.  Associated with generalized weakness, rhinorrhea, nonproductive cough, and subjective fevers.  Per the patient's husband at bedside, for the past 2 days, the patient has asked to go to the commode to urinate multiple times worse at night 3-4 times but with very minimal urine output.  Also with constipation and lower abdominal pain for the past 2 days.  Per her husband she has been unsteady on her feet for at least a year and today she was weaker in her legs and was unable to assist with shifting or transferring.  Required 2 persons assist to move her today, her husband and her son.  EMS was activated and the patient was brought into the ED for further evaluation.  In the ED, febrile with Tmax 102.7, tachycardic heart rate of 115, tachypneic respiratory of 25.  Noncontrast Head CT scan was nonacute.  Chest x-ray showing mild right basilar linear scarring and or atelectasis.  UA is pending.  COVID-19 screening test returned positive on 12/29/2022.  Due to concern for possible sepsis, peripheral blood cultures x 2 were obtained and the patient was started on broad-spectrum IV antibiotics until sepsis is ruled out.  ED Course: Tmax 102.7.  BP 148/114, pulse 123, respiratory rate 29, O2 saturation 95% on room  air.  Lab studies notable for WBC 10.9, neutrophil count 9.4.  Lymphocyte count 0.6.  Serum glucose 157.  BUN 21, creatinine 0.82.  GFR greater than 60.  High-sensitivity troponin 6.  Lactic acid 2.5.  Review of Systems: Review of systems as noted in the HPI. All other systems reviewed and are negative.   Past Medical History:  Diagnosis Date   Dry eyes    Emphysema lung (HCC)    Hypercholesterolemia    Hypertension    Neck pain    Osteopenia    Osteopenia    Syncope and collapse    Vitamin D deficiency    Past Surgical History:  Procedure Laterality Date   CATARACT EXTRACTION     CATARACT EXTRACTION     INCISION AND DRAINAGE OF PERITONSILLAR ABCESS Right 03/25/2022   Procedure: INCISION AND DRAINAGE OF PERITONSILLAR ABCESS;  Surgeon: Trixie Dredge., MD;  Location: [facility] OR;  Service: ENT;  Laterality: Right;  PTA/Retropharyngeal abscess   SQUAMOUS CELL CARCINOMA EXCISION     Lip    Social History:  reports that she has quit smoking. Her smoking use included cigarettes. She smoked an average of .5 packs per day. She has never used smokeless tobacco. She reports that she does not drink alcohol and does not use drugs.   Allergies  Allergen Reactions   Albuterol Sulfate     Other reaction(s): rash in mouth,   Aspirin Nausea Only   Codeine Nausea Only   Dorzolamide Hcl-Timolol Mal Other (See Comments)   Penicillin G Sodium     Other reaction(s): she had a rash with use of augmentin  Penicillins Other (See Comments)    Pt's son states she is not allergic to penicillin  Tolerated ceftriaxone (2018)    Statins     Other reaction(s): Intolerance (side effects from meds)   Zetia [Ezetimibe] Other (See Comments)    Muscle deterioration     Family History  Problem Relation Age of Onset   Diabetes Mother    Stroke Mother    Melanoma Sister    Heart disease Brother    Alzheimer's disease Brother       Prior to Admission medications   Medication Sig Start Date End  Date Taking? Authorizing Provider  Acetaminophen (TYLENOL PO) Take 1-2 tablets by mouth daily as needed (pain, fever).    [provider]  Cholecalciferol (VITAMIN D-3 PO) Take 1 capsule by mouth daily.    [provider]  GAVILAX 17 GM/SCOOP powder Take 17 g by mouth daily as needed (constipation). 11/27/21   [provider]  ondansetron (ZOFRAN-ODT) 4 MG disintegrating tablet Take 4 mg by mouth 2 (two) times daily as needed for nausea/vomiting. 11/29/21   [provider]  oxyCODONE-acetaminophen (PERCOCET) 5-325 MG tablet Take 1 tablet by mouth every 8 (eight) hours as needed for severe pain. 03/27/22 03/27/23  GhimireWerner Lean, MD    Physical Exam: BP 132/64   Pulse (!) 120   Temp (!) 102.7 F (39.3 C) (Rectal)   Resp (!) 30   Ht 5\' 6"  (1.676 m)   Wt 52 kg   SpO2 91%   BMI 18.50 kg/m   General: 82 y.o. year-old female well developed well nourished in no acute distress.  Alert and confused. Cardiovascular: Tachycardic with no rubs or gallops.  No thyromegaly or JVD noted.  Bilateral lower extremity edema noted.  Respiratory: Mild diffuse rales bilaterally.  Poor inspiratory effort. Abdomen: Soft diffusely tender with palpation nondistended with normal bowel sounds x4 quadrants. Muskuloskeletal: No cyanosis or clubbing noted bilaterally Neuro: CN II-XII intact, sensation. Skin: No ulcerative lesions noted or rashes Psychiatry: Judgement and insight appear altered. Mood is appropriate for condition and setting          Labs on Admission:  Basic Metabolic Panel: Recent Labs  Lab 12/29/22 1825  NA 137  K 3.8  CL 102  CO2 23  GLUCOSE 157*  BUN 21  CREATININE 0.82  CALCIUM 9.2   Liver Function Tests: Recent Labs  Lab 12/29/22 1825  AST 17  ALT 11  ALKPHOS 90  BILITOT 0.8  PROT 7.6  ALBUMIN 3.7   No results for input(s): "LIPASE", "AMYLASE" in the last 168 hours. No results for input(s): "AMMONIA" in the last 168 hours. CBC: Recent  Labs  Lab 12/29/22 1825  WBC 10.9*  NEUTROABS 9.4*  HGB 14.6  HCT 45.2  MCV 96.2  PLT 349   Cardiac Enzymes: No results for input(s): "CKTOTAL", "CKMB", "CKMBINDEX", "TROPONINI" in the last 168 hours.  BNP (last 3 results) No results for input(s): "BNP" in the last 8760 hours.  ProBNP (last 3 results) No results for input(s): "PROBNP" in the last 8760 hours.  CBG: No results for input(s): "GLUCAP" in the last 168 hours.  Radiological Exams on Admission: DG Chest Port 1 View  Result Date: 12/29/2022 CLINICAL DATA:  Shortness of breath and constipation with weakness. EXAM: PORTABLE CHEST 1 VIEW COMPARISON:  March 24, 2022 FINDINGS: The heart size and mediastinal contours are within normal limits. Mildly decreased lung volumes are seen with mild elevation of the right hemidiaphragm. Mild, diffuse,  chronic appearing increased interstitial lung markings are also noted. Mild right basilar linear scarring and/or atelectasis is seen. There is no evidence of a pleural effusion or pneumothorax. Multilevel degenerative changes are seen throughout the thoracic spine. IMPRESSION: Mildly decreased lung volumes with mild right basilar linear scarring and/or atelectasis. Electronically Signed   By: Aram Candela M.D.   On: 12/29/2022 19:27   CT Head Wo Contrast  Result Date: 12/29/2022 CLINICAL DATA:  Altered mental status EXAM: CT HEAD WITHOUT CONTRAST TECHNIQUE: Contiguous axial images were obtained from the base of the skull through the vertex without intravenous contrast. RADIATION DOSE REDUCTION: This exam was performed according to the departmental dose-optimization program which includes automated exposure control, adjustment of the mA and/or kV according to patient size and/or use of iterative reconstruction technique. COMPARISON:  11/15/2021 FINDINGS: Brain: There is no mass, hemorrhage or extra-axial collection. The size and configuration of the ventricles and extra-axial CSF spaces are  normal. There is hypoattenuation of the white matter, most commonly indicating chronic small vessel disease. Vascular: No abnormal hyperdensity of the major intracranial arteries or dural venous sinuses. No intracranial atherosclerosis. Skull: The visualized skull base, calvarium and extracranial soft tissues are normal. Sinuses/Orbits: No fluid levels or advanced mucosal thickening of the visualized paranasal sinuses. No mastoid or middle ear effusion. The orbits are normal. IMPRESSION: Chronic small vessel disease without acute intracranial abnormality. Electronically Signed   By: Deatra Robinson M.D.   On: 12/29/2022 19:17    EKG: I independently viewed the EKG done and my findings are as followed: Sinus tachycardia rate of 135.  Nonspecific ST-T changes.  QTc 473.  Assessment/Plan Present on Admission:  AMS (altered mental status)  Principal Problem:   AMS (altered mental status)  Acute metabolic encephalopathy suspect secondary to COVID-19 viral infection versus others Delirium, with reported waxing and waning altered mental status Noncontrast CT head was nonacute UA still pending Reorient as needed Treat underlying conditions Regulate sleep and awake cycle to avoid delirium  COVID-19 viral infection Currently with mild hypoxia with O2 saturation of 88% on room air Personally reviewed chest x-ray which shows mildly decreased lung volumes with mild right basilar linear scarring and or atelectasis. High risk for complication, emphysema, former smoker, age greater than 11, hypoxia Will treat with IV remdesivir, IV Solu-Medrol, antitussives, bronchodilators. Check inflammatory markers in the morning  Presumed UTI, POA SIRS Febrile with Tmax 102.7, tachycardia, tachypnea Increased urinary frequency with lower abdomen pain UA is pending Received IV vancomycin x 1 dose, cefepime x 1 dose in the ED due to concern for sepsis, will de-escalate for now. Rocephin 1 g daily x 3 days or until  UTI is ruled out. P.o. vancomycin x 10 days while on antibiotics prophylactically to avoid recurrence of C. difficile infection.  Bilateral lower extremity edema and tenderness on exam Per the patient's husband at bedside, she has been very inactive in the past 10 days Bilateral Doppler ultrasound to rule out DVT Subcu Lovenox daily for DVT prophylaxis  Sinus tachycardia suspect related to dehydration, fever Received IV fluid in the ED, 1 L normal saline bolus x 1 Fever was treated with Tylenol 1 g x 1. Monitor on telemetry  Hyperglycemia Serum glucose 157 No reported history of diabetes Obtain hemoglobin A1c Hold off insulin for now until A1c has resulted or until insulin is indicated.  Chronic cervicalgia Uses a neck pillow, family will bring Supportive care  Generalized weakness PT OT assessment Fall precautions  History of recurrent C.  difficile infection and diarrhea History of C. difficile infection with recurrence 04/19/2022, 05/27/2022, 07/01/22 Per husband at bedside she has been constipated for the past 2 days Start p.o. vancomycin 125 mg daily x 10 days while on antibiotics, prophylactically for recurrent C. difficile infection. Add Florastor 250 mg twice daily   Time: 75 minutes.   DVT prophylaxis: Subcu Lovenox daily  Code Status: Full code, per the patient's husband at bedside.  Family Communication: Updated the patient's husband at bedside.  Also updated the patient's son via phone.  Disposition Plan: Admitted to progressive care unit  Consults called: None.  Admission status: Inpatient status.   Status is: Inpatient The patient requires at least 2 midnights for further evaluation and treatment of present condition.   Darlin Drop MD [facility] Pager [phone]  If 7PM-7AM, please contact night-coverage [email or web address] Password [email or web address]  12/29/2022, 8:02 PM

## 2022-12-29 NOTE — Progress Notes (Signed)
A consult was received from an ED physician for Vancomycin and Cefepime per pharmacy dosing.  The patient's profile has been reviewed for ht/wt/allergies/indication/available labs.    A one time order has been placed for Vancomycin 1g IV and Cefepime 2g IV.  Further antibiotics/pharmacy consults should be ordered by admitting physician if indicated.                       Thank you, Jamse Mead 12/29/2022  6:38 PM

## 2022-12-29 NOTE — ED Provider Notes (Signed)
[place] EMERGENCY DEPARTMENT AT [facility] Provider Note   CSN: [id number] Arrival date & time: 12/29/22  1745     History  Chief Complaint  Patient presents with   Weakness   Altered Mental Status    Alyssa Howard is a 82 y.o. female.  82 year old female with prior medical history as detailed below presents for evaluation.  Patient arrives with EMS transport from home.  Per EMS patient with increased confusion.  Family is concerned about possible infection.  Patient is pleasantly confused on arrival.  She is without specific complaint.  Family with mild nonproductive cough and persistent rhinorrhea x 3 to 4 days.  Medical history significant for emphysema, hypertension, hyperlipidemia.  Per family, patient with minimal to no medications taken on a daily basis.  The history is provided by the patient and medical records.       Home Medications Prior to Admission medications   Medication Sig Start Date End Date Taking? Authorizing Provider  Acetaminophen (TYLENOL PO) Take 1-2 tablets by mouth daily as needed (pain, fever).    [provider]  Cholecalciferol (VITAMIN D-3 PO) Take 1 capsule by mouth daily.    [provider]  GAVILAX 17 GM/SCOOP powder Take 17 g by mouth daily as needed (constipation). 11/27/21   [provider]  ondansetron (ZOFRAN-ODT) 4 MG disintegrating tablet Take 4 mg by mouth 2 (two) times daily as needed for nausea/vomiting. 11/29/21   [provider]  oxyCODONE-acetaminophen (PERCOCET) 5-325 MG tablet Take 1 tablet by mouth every 8 (eight) hours as needed for severe pain. 03/27/22 03/27/23  GhimireWerner Lean, MD      Allergies    Albuterol sulfate, Aspirin, Codeine, Dorzolamide hcl-timolol mal, Penicillin g sodium, Penicillins, Statins, and Zetia [ezetimibe]    Review of Systems   Review of Systems  All other systems reviewed and are negative.   Physical Exam Updated Vital Signs BP 132/64    Pulse (!) 120   Temp (!) 102.7 F (39.3 C) (Rectal)   Resp (!) 30   Ht 5\' 6"  (1.676 m)   Wt 52 kg   SpO2 91%   BMI 18.50 kg/m  Physical Exam Vitals and nursing note reviewed.  Constitutional:      General: She is not in acute distress.    Appearance: Normal appearance. She is well-developed.  HENT:     Head: Normocephalic and atraumatic.     Nose: Rhinorrhea present.  Eyes:     Conjunctiva/sclera: Conjunctivae normal.     Pupils: Pupils are equal, round, and reactive to light.  Cardiovascular:     Rate and Rhythm: Regular rhythm. Tachycardia present.     Heart sounds: Normal heart sounds.  Pulmonary:     Effort: Pulmonary effort is normal. No respiratory distress.     Breath sounds: Normal breath sounds.  Abdominal:     General: There is no distension.     Palpations: Abdomen is soft.     Tenderness: There is no abdominal tenderness.  Musculoskeletal:        General: No deformity. Normal range of motion.     Cervical back: Normal range of motion and neck supple.  Skin:    General: Skin is warm and dry.  Neurological:     General: No focal deficit present.     Mental Status: She is alert.     Comments: Alert, oriented to person and place, normal speech, no facial droop, moves all 4 extremities equally.  ED Results / Procedures / Treatments   Labs (all labs ordered are listed, but only abnormal results are displayed) Labs Reviewed  CBC WITH DIFFERENTIAL/PLATELET - Abnormal; Notable for the following components:      Result Value   WBC 10.9 (*)    Neutro Abs 9.4 (*)    Lymphs Abs 0.6 (*)    All other components within normal limits  COMPREHENSIVE METABOLIC PANEL - Abnormal; Notable for the following components:   Glucose, Bld 157 (*)    All other components within normal limits  CULTURE, BLOOD (ROUTINE X 2)  CULTURE, BLOOD (ROUTINE X 2)  SARS CORONAVIRUS 2 BY RT PCR  LACTIC ACID, PLASMA  LACTIC ACID, PLASMA  BRAIN NATRIURETIC PEPTIDE  URINALYSIS, W/ REFLEX  TO CULTURE (INFECTION SUSPECTED)  TROPONIN I (HIGH SENSITIVITY)    EKG EKG Interpretation Date/Time:  Sunday December 29 2022 18:16:14 EDT Ventricular Rate:  119 PR Interval:    QRS Duration:  95 QT Interval:  289 QTC Calculation: 407 R Axis:   61  Text Interpretation: Sinus tachycardia Poor baseline Confirmed by Kristine Royal [phone]) on 12/29/2022 6:31:35 PM  Radiology CT Head Wo Contrast  Result Date: 12/29/2022 CLINICAL DATA:  Altered mental status EXAM: CT HEAD WITHOUT CONTRAST TECHNIQUE: Contiguous axial images were obtained from the base of the skull through the vertex without intravenous contrast. RADIATION DOSE REDUCTION: This exam was performed according to the departmental dose-optimization program which includes automated exposure control, adjustment of the mA and/or kV according to patient size and/or use of iterative reconstruction technique. COMPARISON:  11/15/2021 FINDINGS: Brain: There is no mass, hemorrhage or extra-axial collection. The size and configuration of the ventricles and extra-axial CSF spaces are normal. There is hypoattenuation of the white matter, most commonly indicating chronic small vessel disease. Vascular: No abnormal hyperdensity of the major intracranial arteries or dural venous sinuses. No intracranial atherosclerosis. Skull: The visualized skull base, calvarium and extracranial soft tissues are normal. Sinuses/Orbits: No fluid levels or advanced mucosal thickening of the visualized paranasal sinuses. No mastoid or middle ear effusion. The orbits are normal. IMPRESSION: Chronic small vessel disease without acute intracranial abnormality. Electronically Signed   By: Deatra Robinson M.D.   On: 12/29/2022 19:17    Procedures Procedures    Medications Ordered in ED Medications  vancomycin (VANCOCIN) IVPB 1000 mg/200 mL premix (has no administration in time range)  acetaminophen (TYLENOL) tablet 1,000 mg (1,000 mg Oral Given 12/29/22 1853)  ceFEPIme (MAXIPIME) 2 g  in sodium chloride 0.9 % 100 mL IVPB (0 g Intravenous Stopped 12/29/22 1926)    ED Course/ Medical Decision Making/ A&P                             Medical Decision Making Amount and/or Complexity of Data Reviewed Labs: ordered. Radiology: ordered.  Risk OTC drugs. Prescription drug management. Decision regarding hospitalization.    Medical Screen Complete  This patient presented to the ED with complaint of fever, delirium, rhinorrhea, cough.  This complaint involves an extensive number of treatment options. The initial differential diagnosis includes, but is not limited to, viral versus bacterial infection, metabolic abnormality, etc.  This presentation is: Acute, Chronic, Self-Limited, Previously Undiagnosed, Uncertain Prognosis, Complicated, Systemic Symptoms, and Threat to Life/Bodily Function  Patient is presenting for evaluation of approximately 4 days of decline in function with associated rhinorrhea, mild confusion.  Patient was noted to be febrile on arrival.  Exam is significant for mild  cough and associated rhinorrhea.  Initial vitals include fever of 102.7, tachycardia.  Patient's EKG is most suggestive of tachycardia related to fever and possible dehydration.  No prior history of arrhythmia such as atrial fibrillation reported.  Patient is mildly hypoxic into the upper 80s.  With 2 L nasal cannula supplemental O2 her sats are improved to the low 90s.  She appears to be comfortable as far as her respirations.  Chest x-ray without clear evidence of pneumonia.  Lactic acid is increased at 2.5.  Broad-spectrum antibiotics administered in the ED on arrival.  Patient is notably positive for COVID.  Patient would benefit from admission for further workup and treatment.  Importance of close follow-up is stressed.   Additional history obtained:  Additional history obtained from [facility] External records from outside sources obtained and reviewed including prior ED  visits and prior Inpatient records.    Lab Tests:  I ordered and personally interpreted labs.    Imaging Studies ordered:  I ordered imaging studies including chest x-ray, CT head I independently visualized and interpreted obtained imaging which showed NAD I agree with the radiologist interpretation.   Cardiac Monitoring:  The patient was maintained on a cardiac monitor.  I personally viewed and interpreted the cardiac monitor which showed an underlying rhythm of: Sinus tach   Medicines ordered:  I ordered medication including IV fluids, acetaminophen, antibiotics for suspected infection Reevaluation of the patient after these medicines showed that the patient: improved    Problem List / ED Course:  Weakness, confusion, acute COVID infection, elevated lactic acid, fever, tachycardia   Reevaluation:  After the interventions noted above, I reevaluated the patient and found that they have: improved  Disposition:  After consideration of the diagnostic results and the patients response to treatment, I feel that the patent would benefit from admission.          Final Clinical Impression(s) / ED Diagnoses Final diagnoses:  Altered mental status, unspecified altered mental status type  Weakness  Rhinorrhea  Fever, unspecified fever cause  COVID    Rx / DC Orders ED Discharge Orders     None         Wynetta Fines, MD 12/29/22 [phone]

## 2022-12-30 ENCOUNTER — Inpatient Hospital Stay (HOSPITAL_COMMUNITY): Payer: Medicare Other

## 2022-12-30 DIAGNOSIS — M7989 Other specified soft tissue disorders: Secondary | ICD-10-CM | POA: Diagnosis not present

## 2022-12-30 DIAGNOSIS — R4182 Altered mental status, unspecified: Secondary | ICD-10-CM | POA: Diagnosis not present

## 2022-12-30 LAB — URINALYSIS, W/ REFLEX TO CULTURE (INFECTION SUSPECTED)
Bilirubin Urine: NEGATIVE
Glucose, UA: NEGATIVE mg/dL
Ketones, ur: NEGATIVE mg/dL
Leukocytes,Ua: NEGATIVE
Nitrite: NEGATIVE
Protein, ur: NEGATIVE mg/dL
Specific Gravity, Urine: 1.017 (ref 1.005–1.030)
pH: 5 (ref 5.0–8.0)

## 2022-12-30 LAB — BLOOD CULTURE ID PANEL (REFLEXED) - BCID2

## 2022-12-30 LAB — COMPREHENSIVE METABOLIC PANEL
ALT: 12 U/L (ref 0–44)
AST: 16 U/L (ref 15–41)
Albumin: 3 g/dL — ABNORMAL LOW (ref 3.5–5.0)
Alkaline Phosphatase: 72 U/L (ref 38–126)
Anion gap: 6 (ref 5–15)
BUN: 16 mg/dL (ref 8–23)
CO2: 23 mmol/L (ref 22–32)
Calcium: 8.7 mg/dL — ABNORMAL LOW (ref 8.9–10.3)
Chloride: 107 mmol/L (ref 98–111)
Creatinine, Ser: 0.7 mg/dL (ref 0.44–1.00)
GFR, Estimated: >60 mL/min (ref >60)
Glucose, Bld: 157 mg/dL — ABNORMAL HIGH (ref 70–99)
Potassium: 4 mmol/L (ref 3.5–5.1)
Sodium: 136 mmol/L (ref 135–145)
Total Bilirubin: 0.4 mg/dL (ref 0.3–1.2)
Total Protein: 6.6 g/dL (ref 6.5–8.1)

## 2022-12-30 LAB — CBC
HCT: 39.3 % (ref 36.0–46.0)
Hemoglobin: 12.8 g/dL (ref 12.0–15.0)
MCH: 31 pg (ref 26.0–34.0)
MCHC: 32.6 g/dL (ref 30.0–36.0)
MCV: 95.2 fL (ref 80.0–100.0)
Platelets: 314 10*3/uL (ref 150–400)
RBC: 4.13 MIL/uL (ref 3.87–5.11)
RDW: 12.6 % (ref 11.5–15.5)
WBC: 6 10*3/uL (ref 4.0–10.5)
nRBC: 0 % (ref 0.0–0.2)

## 2022-12-30 LAB — CULTURE, BLOOD (ROUTINE X 2)

## 2022-12-30 LAB — PHOSPHORUS: Phosphorus: 4.2 mg/dL (ref 2.5–4.6)

## 2022-12-30 LAB — GLUCOSE, CAPILLARY
Glucose-Capillary: 144 mg/dL — ABNORMAL HIGH (ref 70–99)
Glucose-Capillary: 162 mg/dL — ABNORMAL HIGH (ref 70–99)

## 2022-12-30 LAB — MAGNESIUM: Magnesium: 2.1 mg/dL (ref 1.7–2.4)

## 2022-12-30 MED ORDER — ENOXAPARIN SODIUM 40 MG/0.4ML IJ SOSY
40.0000 mg | PREFILLED_SYRINGE | INTRAMUSCULAR | Status: DC
  Administered 2022-12-30 – 2023-01-01 (×3): 40 mg via SUBCUTANEOUS
  Filled 2022-12-30 (×3): qty 0.4

## 2022-12-30 MED ORDER — QUETIAPINE FUMARATE 25 MG PO TABS
25.0000 mg | ORAL_TABLET | Freq: Every day | ORAL | Status: DC
  Administered 2022-12-30 – 2023-01-01 (×4): 25 mg via ORAL
  Filled 2022-12-30 (×4): qty 1

## 2022-12-30 NOTE — Evaluation (Signed)
Physical Therapy Evaluation Patient Details Name: Alyssa Howard MRN: [id number] DOB: 1941-03-04 Today's Date: 12/30/2022  History of Present Illness  82 y.o. female who presents with worsening confusion, generalized weakness, rhinorrhea, nonproductive cough, and subjective fevers; admitted for COVID.   PMH: hypertension, emphysema, former tobacco user, cervicalgia, kyphosis, history of osteoporosis with pathological fracture, history of treated peritonsillar abscess, history of C. difficile infection with recurrence.  Clinical Impression  Patient evaluated by Physical Therapy with no further acute PT needs identified. All education has been completed and the patient has no further questions.  See below for mobility; pt is at her baseline per husband. Pt has been at this level of requiring assist for transfers for ~ 1year (husband assists pt at home, pt son is also living with them now to assist as well;  pt husband declines need for PT in acute setting as well as f/u PT  See below for any follow-up Physical Therapy or equipment needs. PT is signing off. Thank you for this referral.         Assistance Recommended at Discharge Frequent or constant Supervision/Assistance  If plan is discharge home, recommend the following:  Can travel by private vehicle  A lot of help with walking and/or transfers;A lot of help with bathing/dressing/bathroom;Help with stairs or ramp for entrance;Assistance with cooking/housework;Assist for transportation        Equipment Recommendations None recommended by PT  Recommendations for Other Services       Functional Status Assessment Patient has not had a recent decline in their functional status     Precautions / Restrictions Precautions Precautions: Fall Restrictions Weight Bearing Restrictions: No      Mobility  Bed Mobility Overal bed mobility: Needs Assistance Bed Mobility: Sit to Supine       Sit to supine: Min assist, Mod assist    General bed mobility comments: assist to lift bil LEs on to bed, cues to get to middle of bed-husband states she unable to assist with positioning LEs    Transfers Overall transfer level: Needs assistance Equipment used: 2 person hand held assist Transfers: Sit to/from Stand, Bed to chair/wheelchair/BSC Sit to Stand: Min assist, +2 physical assistance, +2 safety/equipment   Step pivot transfers: Min assist, +2 physical assistance, +2 safety/equipment       General transfer comment: cues to transition to standing- feet blocked by therapists to maintain knee flexion; assist to rise from [facility] and recliner; assist to balance and maintain upright for BSC.chair step pivot and chair >bed step pivot    Ambulation/Gait               General Gait Details: husband declined attempt to ambulate  Stairs            Wheelchair Mobility     Tilt Bed    Modified Rankin (Stroke Patients Only)       Balance Overall balance assessment: Needs assistance Sitting-balance support: Feet supported, No upper extremity supported Sitting balance-Leahy Scale: Fair     Standing balance support: Bilateral upper extremity supported, During functional activity Standing balance-Leahy Scale: Poor Standing balance comment: reliant on UEs and external support                             Pertinent Vitals/Pain Pain Assessment Pain Assessment: No/denies pain    Home Living Family/patient expects to be discharged to:: Private residence Living Arrangements: Spouse/significant other   Type of Home: House Home  Access: Stairs to enter   [employer] of Steps: 2   Home Layout: One level Home Equipment: Rollator (4 wheels);Wheelchair - manual;Hospital bed Additional Comments: recliner. son has moved back home to help 24/7    Prior Function               Mobility Comments: reported having HH therapy at home       Hand Dominance   Dominant Hand: Right     Extremity/Trunk Assessment   Upper Extremity Assessment Upper Extremity Assessment: Defer to OT evaluation    Lower Extremity Assessment Lower Extremity Assessment: Generalized weakness (AROM grossly WFL;)    Cervical / Trunk Assessment Cervical / Trunk Assessment: Kyphotic  Communication   Communication: No difficulties  Cognition Arousal/Alertness: Awake/alert Behavior During Therapy: WFL for tasks assessed/performed Overall Cognitive Status: Impaired/Different from baseline Area of Impairment: Following commands, Problem solving                       Following Commands: Follows one step commands with increased time     Problem Solving: Slow processing, Decreased initiation, Difficulty sequencing, Requires verbal cues, Requires tactile cues General Comments: pt husband responds to questions and frequently answers for pt; pt endorses being anxious, fearful of falling        General Comments      Exercises     Assessment/Plan    PT Assessment Patient does not need any further PT services (pt husband declines PT for pt)  PT Problem List         PT Treatment Interventions      PT Goals (Current goals can be found in the Care Plan section)  Acute Rehab PT Goals PT Goal Formulation: All assessment and education complete, DC therapy    Frequency       Co-evaluation PT/OT/SLP Co-Evaluation/Treatment: Yes Reason for Co-Treatment: To address functional/ADL transfers PT goals addressed during session: Mobility/safety with mobility OT goals addressed during session: ADL's and self-care       AM-PAC PT "6 Clicks" Mobility  Outcome Measure Help needed turning from your back to your side while in a flat bed without using bedrails?: A Little Help needed moving from lying on your back to sitting on the side of a flat bed without using bedrails?: A Little Help needed moving to and from a bed to a chair (including a wheelchair)?: A Lot Help needed standing up  from a chair using your arms (e.g., wheelchair or bedside chair)?: A Lot Help needed to walk in hospital room?: Total Help needed climbing 3-5 steps with a railing? : Total 6 Click Score: 12    End of Session Equipment Utilized During Treatment: Gait belt Activity Tolerance: Patient tolerated treatment well Patient left: in bed;with call bell/phone within reach;with bed alarm set;with family/visitor present   PT Visit Diagnosis: Other abnormalities of gait and mobility (R26.89);Muscle weakness (generalized) (M62.81)    Time: 1610-9604 PT Time Calculation (min) (ACUTE ONLY): 22 min   Charges:   PT Evaluation $PT Eval Low Complexity: 1 Low   PT General Charges $$ ACUTE PT VISIT: 1 Visit         Atiya Yera, PT  Acute Rehab Dept ([facility]) [phone]  12/30/2022   [facility] 12/30/2022, 2:00 PM

## 2022-12-30 NOTE — Evaluation (Signed)
Occupational Therapy Evaluation Patient Details Name: Alyssa Howard MRN: [id number] DOB: 08-06-1940 Today's Date: 12/30/2022   History of Present Illness 82 y.o. female who presents with worsening confusion, generalized weakness, rhinorrhea, nonproductive cough, and subjective fevers; admitted for COVID.   PMH: hypertension, emphysema, former tobacco user, cervicalgia, kyphosis, history of osteoporosis with pathological fracture, history of treated peritonsillar abscess, history of C. difficile infection with recurrence.   Clinical Impression   Patient evaluated by Occupational Therapy with no further acute OT needs identified. All education has been completed and the patient has no further questions. Patient and husband declined to further participate in therapy services at this time. Husband reported he would reach out if they needed [place] but he doubted it. See below for any follow-up Occupational Therapy or equipment needs. OT is signing off. Thank you for this referral.       Recommendations for follow up therapy are one component of a multi-disciplinary discharge planning process, led by the attending physician.  Recommendations may be updated based on patient status, additional functional criteria and insurance authorization.   Assistance Recommended at Discharge Frequent or constant Supervision/Assistance  Patient can return home with the following Two people to help with walking and/or transfers;A lot of help with bathing/dressing/bathroom;Assistance with cooking/housework;Direct supervision/assist for medications management;Assist for transportation;Help with stairs or ramp for entrance;Direct supervision/assist for financial management       Equipment Recommendations  None recommended by OT       Precautions / Restrictions Precautions Precautions: Fall Restrictions Weight Bearing Restrictions: No      Mobility Bed Mobility Overal bed mobility: Needs Assistance Bed  Mobility: Sit to Supine       Sit to supine: Min assist, Mod assist   General bed mobility comments: assist to lift bil LEs on to bed, cues to get to middle of bed-husband states she unable to assist with positioning LEs        Balance Overall balance assessment: Needs assistance Sitting-balance support: Feet supported, No upper extremity supported Sitting balance-Leahy Scale: Fair     Standing balance support: Bilateral upper extremity supported, During functional activity Standing balance-Leahy Scale: Poor Standing balance comment: reliant on UEs and external support         ADL either performed or assessed with clinical judgement   ADL Overall ADL's : At baseline       General ADL Comments: patient was +2 for safety to turn and transfer from recliner in room to [facility] and back to bed. patient appears to be anxious with all movements with patient endorsing fear of falling. patients husband reported that they have done therapy in the past and it does not help at all. patients husband reported that he is very familar with the therapy process and apprechiative but it does not work for them. patient was TD for hygiene with patient unable to participate in hygiene tasks secondary to being provided a wash cloth v.s. "three sheets of toilet tissue". patient was overly fixated on wash cloth use during session. patient's husband attempted to describe their very specific processes for tasks at home with limited ability to carryover some of them while in the hosptial.     Vision   Vision Assessment?: No apparent visual deficits            Pertinent Vitals/Pain Pain Assessment Pain Assessment: No/denies pain     Hand Dominance Right   Extremity/Trunk Assessment Upper Extremity Assessment Upper Extremity Assessment: Generalized weakness (some tremor like movements in LUE  at rest with husband reporting "its because she is dehydrated".)   Lower Extremity Assessment Lower Extremity  Assessment: Defer to PT evaluation   Cervical / Trunk Assessment Cervical / Trunk Assessment: Kyphotic   Communication Communication Communication: No difficulties   Cognition Arousal/Alertness: Awake/alert Behavior During Therapy: WFL for tasks assessed/performed Overall Cognitive Status: Impaired/Different from baseline Area of Impairment: Following commands, Problem solving                       Following Commands: Follows one step commands with increased time     Problem Solving: Slow processing, Decreased initiation, Difficulty sequencing, Requires verbal cues, Requires tactile cues General Comments: pt husband responds to questions and frequently answers for pt; pt endorses being anxious, fearful of falling     General Comments       Exercises     Shoulder Instructions      Home Living Family/patient expects to be discharged to:: Private residence Living Arrangements: Spouse/significant other Available Help at Discharge: Family Type of Home: House Home Access: Stairs to enter Secretary/administrator of Steps: 2   Home Layout: One level     Bathroom Shower/Tub: Sponge bathes at baseline   [employer]: Standard     Home Equipment: Rollator (4 wheels);Wheelchair - manual;Hospital bed   Additional Comments: recliner. son has moved back home to help 24/7      Prior Functioning/Environment Prior Level of Function : Needs assist             Mobility Comments: reported having HH therapy at home but it "was a waste of time" per husband report ADLs Comments: Pt's husband assisting with all ADLs since back fx        OT Problem List:        OT Treatment/Interventions:      OT Goals(Current goals can be found in the care plan section) Acute Rehab OT Goals OT Goal Formulation: All assessment and education complete, DC therapy  OT Frequency:      Co-evaluation   Reason for Co-Treatment: To address functional/ADL transfers PT goals  addressed during session: Mobility/safety with mobility OT goals addressed during session: ADL's and self-care         End of Session Equipment Utilized During Treatment: Gait belt Nurse Communication: Other (comment) (ok to participate in session)  Activity Tolerance: Patient limited by fatigue Patient left: in bed;with call bell/phone within reach;with bed alarm set;with family/visitor present  OT Visit Diagnosis: Unsteadiness on feet (R26.81);Muscle weakness (generalized) (M62.81)                Time: 1610-9604 OT Time Calculation (min): 22 min Charges:  OT General Charges $OT Visit: 1 Visit OT Evaluation $OT Eval Low Complexity: 1 Low  Lynlee Stratton OTR/L, MS Acute Rehabilitation Department Office# [phone]   Selinda Flavin 12/30/2022, 5:08 PM

## 2022-12-30 NOTE — Progress Notes (Signed)
VASCULAR LAB    Bilateral lower extremity venous duplex has been performed.  See CV proc for preliminary results.   Gracelynn Bircher, RVT 12/30/2022, 8:40 AM

## 2022-12-30 NOTE — Progress Notes (Signed)
PHARMACY - PHYSICIAN COMMUNICATION CRITICAL VALUE ALERT - BLOOD CULTURE IDENTIFICATION (BCID)  Alyssa Howard is an 82 y.o. female who presented to [facility] on 12/29/2022 with a chief complaint of metabolic encephalopathy  Assessment:  1/4 bottles with staph species, likely contaminant  Name of physician (or Provider) Contacted: Johann Capers  Current antibiotics: +COVID on remdesivir  Changes to prescribed antibiotics recommended:  No changes  Results for orders placed or performed during the hospital encounter of 12/29/22  Blood Culture ID Panel (Reflexed) (Collected: 12/29/2022  6:25 PM)  Result Value Ref Range   Enterococcus faecalis NOT DETECTED NOT DETECTED   Enterococcus Faecium NOT DETECTED NOT DETECTED   Listeria monocytogenes NOT DETECTED NOT DETECTED   Staphylococcus species DETECTED (A) NOT DETECTED   Staphylococcus aureus (BCID) NOT DETECTED NOT DETECTED   Staphylococcus epidermidis NOT DETECTED NOT DETECTED   Staphylococcus lugdunensis NOT DETECTED NOT DETECTED   Streptococcus species NOT DETECTED NOT DETECTED   Streptococcus agalactiae NOT DETECTED NOT DETECTED   Streptococcus pneumoniae NOT DETECTED NOT DETECTED   Streptococcus pyogenes NOT DETECTED NOT DETECTED   A.calcoaceticus-baumannii NOT DETECTED NOT DETECTED   Bacteroides fragilis NOT DETECTED NOT DETECTED   Enterobacterales NOT DETECTED NOT DETECTED   Enterobacter cloacae complex NOT DETECTED NOT DETECTED   Escherichia coli NOT DETECTED NOT DETECTED   Klebsiella aerogenes NOT DETECTED NOT DETECTED   Klebsiella oxytoca NOT DETECTED NOT DETECTED   Klebsiella pneumoniae NOT DETECTED NOT DETECTED   Proteus species NOT DETECTED NOT DETECTED   Salmonella species NOT DETECTED NOT DETECTED   Serratia marcescens NOT DETECTED NOT DETECTED   Haemophilus influenzae NOT DETECTED NOT DETECTED   Neisseria meningitidis NOT DETECTED NOT DETECTED   Pseudomonas aeruginosa NOT DETECTED NOT DETECTED   Stenotrophomonas  maltophilia NOT DETECTED NOT DETECTED   Candida albicans NOT DETECTED NOT DETECTED   Candida auris NOT DETECTED NOT DETECTED   Candida glabrata NOT DETECTED NOT DETECTED   Candida krusei NOT DETECTED NOT DETECTED   Candida parapsilosis NOT DETECTED NOT DETECTED   Candida tropicalis NOT DETECTED NOT DETECTED   Cryptococcus neoformans/gattii NOT DETECTED NOT DETECTED    Berkley Harvey 12/30/2022  7:17 PM

## 2022-12-30 NOTE — Progress Notes (Signed)
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 difficile Rx with Dificid as well as another recurrence occurring 07/01/2022--- saw Rexene Alberts with [facility] last 07/04/2022 and consideration was given at bedtime for VOWST if she was to have a third recurrence  Brought to the hospital for generalized weakness increasing confusion although was oriented to self and place on arrival Note mild productive cough persistent rhinorrhea for several days Tmax 102.7 tachycardic 115 tachypneic 25 CT scan nonacute CXR mild right basilar linear scarring atelectasis COVID-19 positive BUNs/creatinine 21/0.8 LFTs normal BNP 68.1 troponin 6 lactic acid 2.5   Plan  Toxic metabolic encephalopathy secondary to infection on admission secondary to toxic prodrome and hypoxia at admission I am speaking with her and her husband and although she cannot tell me the correct hospital-he confirms that her mentation is much better and that she is closer to her baseline  Sepsis on admission?  COVID + on admission  Continuing at this time remdesivir for at least 3 doses, Solu-Medrol X1 given and then steroid taper-wean oxygen-incentive spirometry as able-monitor trends and hopefully de-escalate-placed on 1 L Prior to admission was not using any oxygen-desat screen as able, prone X 16 hours- -chest x-ray is indicated especially if more hypoxic One-time CRP, D-dimer a.m. 7/9 and monitor  HTN Reported  history of-not on any meds  UA pending-given 1 dose vancomycin 1 dose cefepime--- UA negative no further workup Discontinue all antibiotics given high risk of recurrent C. difficile  Prior history recurrent C. Difficile-started on prophylactic vancomycin p.o. Has had recurrent C. difficile many times and in the outpatient setting has been considered for VOWST There is no strong evidence for probiotics in these cases-I believe the patient will need close follow-up in the outpatient setting with infectious disease We will discontinue all antibiotics as dictated above  Sensorineural hearing loss Moderately hard of hearing but seems stable   DVT prophylaxis: Lovenox  Status is: Inpatient Remains inpatient appropriate because:   Symptomatic COVID    Subjective: Pleasant coherent white female no distress looks about stated age She is verbalizing in full sentences looks quite comfortable does not appear short of breath She is sitting in the chair and can tell me date time person-recognizes her husband cannot name her son knows that she is at [facility] health  Objective + exam Vitals:   12/29/22 2148 12/29/22 2243 12/30/22 0135 12/30/22 0800  BP: 115/66  109/63   Pulse: (!) 103  92   Resp: (!) 25  (!) 22   Temp: 98.3 F (36.8 C)  98 F (36.7 C)   TempSrc: Oral  Oral   SpO2: 94% 94% 94% 94%  Weight: 56.9 kg     Height: 5\' 7"  (1.702 m)      Filed Weights   12/29/22 1756 12/29/22 2148  Weight: 52 kg 56.9 kg    Examination:   NCAT no focal deficit neck soft supple clavicular wasting CTAB decreased air entry posterolaterally ROM intact moving 4 limbs equally  S1-S2 does have a holosystolic murmur Abdomen is soft Lower extremities no edema Neuro is intact moving 4 limbs equally--slightly tremulous with intention  Data Reviewed: reviewed   CBC    Component Value Date/Time   WBC 6.0 12/30/2022 0540   RBC 4.13 12/30/2022 0540   HGB 12.8 12/30/2022 0540   HCT 39.3 12/30/2022  0540   PLT 314 12/30/2022 0540   MCV 95.2 12/30/2022 0540   MCH 31.0 12/30/2022 0540   MCHC 32.6 12/30/2022 0540   RDW 12.6 12/30/2022 0540   LYMPHSABS 0.6 (L) 12/29/2022 1825   MONOABS 0.7 12/29/2022 1825   EOSABS 0.0 12/29/2022 1825   BASOSABS 0.0 12/29/2022 1825      Latest Ref Rng & Units 12/30/2022    5:40 AM 12/29/2022    6:25 PM 03/26/2022    4:03 AM  CMP  Glucose 70 - 99 mg/dL 829  562  130   BUN 8 - 23 mg/dL 16  21  27    Creatinine 0.44 - 1.00 mg/dL 8.65  7.84  6.96   Sodium 135 - 145 mmol/L 136  137  140   Potassium 3.5 - 5.1 mmol/L 4.0  3.8  3.6   Chloride 98 - 111 mmol/L 107  102  106   CO2 22 - 32 mmol/L 23  23  24    Calcium 8.9 - 10.3 mg/dL 8.7  9.2  9.0   Total Protein 6.5 - 8.1 g/dL 6.6  7.6    Total Bilirubin 0.3 - 1.2 mg/dL 0.4  0.8    Alkaline Phos 38 - 126 U/L 72  90    AST 15 - 41 U/L 16  17    ALT 0 - 44 U/L 12  11       Scheduled Meds:  benzonatate  200 mg Oral TID   cholecalciferol  400 Units Oral Daily   enoxaparin (LOVENOX) injection  40 mg Subcutaneous Q24H   folic acid  1 mg Oral Daily   ipratropium-albuterol  3 mL Nebulization TID   methylPREDNISolone (SOLU-MEDROL) injection  0.5 mg/kg Intravenous Q12H   Followed by   Melene Muller ON 01/02/2023] predniSONE  50 mg Oral Daily   multivitamin with minerals  1 tablet Oral Daily   QUEtiapine  25 mg Oral QHS   saccharomyces boulardii  250 mg Oral BID   thiamine  100 mg Oral Daily   vancomycin  125 mg Oral Q0600   Continuous Infusions:  cefTRIAXone (ROCEPHIN)  IV 200 mL/hr at 12/30/22 0600   remdesivir 100 mg in sodium chloride 0.9 % 100 mL IVPB      Time  50  Rhetta Mura, MD  [facility]

## 2022-12-31 DIAGNOSIS — R4182 Altered mental status, unspecified: Secondary | ICD-10-CM | POA: Diagnosis not present

## 2022-12-31 LAB — COMPREHENSIVE METABOLIC PANEL
ALT: 13 U/L (ref 0–44)
AST: 19 U/L (ref 15–41)
Albumin: 2.8 g/dL — ABNORMAL LOW (ref 3.5–5.0)
Alkaline Phosphatase: 70 U/L (ref 38–126)
Anion gap: 9 (ref 5–15)
BUN: 25 mg/dL — ABNORMAL HIGH (ref 8–23)
CO2: 23 mmol/L (ref 22–32)
Calcium: 8.9 mg/dL (ref 8.9–10.3)
Chloride: 106 mmol/L (ref 98–111)
Creatinine, Ser: 0.71 mg/dL (ref 0.44–1.00)
GFR, Estimated: >60 mL/min (ref >60)
Glucose, Bld: 142 mg/dL — ABNORMAL HIGH (ref 70–99)
Potassium: 4.6 mmol/L (ref 3.5–5.1)
Sodium: 138 mmol/L (ref 135–145)
Total Bilirubin: 0.3 mg/dL (ref 0.3–1.2)
Total Protein: 6.3 g/dL — ABNORMAL LOW (ref 6.5–8.1)

## 2022-12-31 LAB — C-REACTIVE PROTEIN: CRP: 3.3 mg/dL — ABNORMAL HIGH (ref <1.0)

## 2022-12-31 LAB — CBC
HCT: 39.8 % (ref 36.0–46.0)
Hemoglobin: 12.9 g/dL (ref 12.0–15.0)
MCH: 31.1 pg (ref 26.0–34.0)
MCHC: 32.4 g/dL (ref 30.0–36.0)
MCV: 95.9 fL (ref 80.0–100.0)
Platelets: 288 10*3/uL (ref 150–400)
RBC: 4.15 MIL/uL (ref 3.87–5.11)
RDW: 12.7 % (ref 11.5–15.5)
WBC: 7.1 10*3/uL (ref 4.0–10.5)
nRBC: 0 % (ref 0.0–0.2)

## 2022-12-31 LAB — D-DIMER, QUANTITATIVE: D-Dimer, Quant: 1.84 ug/mL-FEU — ABNORMAL HIGH (ref 0.00–0.50)

## 2022-12-31 LAB — HEMOGLOBIN A1C
Hgb A1c MFr Bld: 5.2 % (ref 4.8–5.6)
Mean Plasma Glucose: 103 mg/dL

## 2022-12-31 MED ORDER — LEVALBUTEROL HCL 0.63 MG/3ML IN NEBU: 0.6300 mg | INHALATION_SOLUTION | Freq: Four times a day (QID) | RESPIRATORY_TRACT | Status: DC | PRN

## 2022-12-31 MED ORDER — SODIUM CHLORIDE 0.9 % IV SOLN: INTRAVENOUS | Status: DC

## 2022-12-31 NOTE — TOC Initial Note (Signed)
Transition of Care [facility]) - Initial/Assessment Note    Patient Details  Name: Alyssa Howard MRN: [id number] Date of Birth: Nov 05, 1940  Transition of Care [facility]) CM/SW Contact:    Catalina Gravel, LCSW Phone Number: 12/31/2022, 11:29 AM  Clinical Narrative:                 CSW reviewed chart.  Pt spouse declined HHPT.  No other TOC needs noted at this time, should needs change, TOC will reengage.    Barriers to Discharge: Continued Medical Work up   Patient Goals and CMS Choice            Expected Discharge Plan and Services                                              Prior Living Arrangements/Services                       Activities of Daily Living Home Assistive Devices/Equipment: Wheelchair, Environmental consultant (specify type), Raised toilet seat with rails, Eyeglasses (rolator with seat) ADL Screening (condition at time of admission) Patient's cognitive ability adequate to safely complete daily activities?: Yes Is the patient deaf or have difficulty hearing?: Yes Does the patient have difficulty seeing, even when wearing glasses/contacts?: No Does the patient have difficulty concentrating, remembering, or making decisions?: Yes Patient able to express need for assistance with ADLs?: Yes Does the patient have difficulty dressing or bathing?: Yes Independently performs ADLs?: No Communication: Independent Dressing (OT): Needs assistance Is this a change from baseline?: Pre-admission baseline Grooming: Needs assistance Is this a change from baseline?: Pre-admission baseline Feeding: Independent Bathing: Needs assistance Is this a change from baseline?: Pre-admission baseline Toileting: Needs assistance Is this a change from baseline?: Pre-admission baseline In/Out Bed: Needs assistance Is this a change from baseline?: Pre-admission baseline Walks in Home: Needs assistance Is this a change from baseline?: Pre-admission baseline Does the patient have  difficulty walking or climbing stairs?: Yes Weakness of Legs: Both Weakness of Arms/Hands: None  Permission Sought/Granted                  Emotional Assessment              Admission diagnosis:  Rhinorrhea [J34.89] Weakness [R53.1] Fever, unspecified fever cause [R50.9] Altered mental status, unspecified altered mental status type [R41.82] AMS (altered mental status) [R41.82] COVID [U07.1] Patient Active Problem List   Diagnosis Date Noted   AMS (altered mental status) 12/29/2022   Recurrent Clostridioides difficile diarrhea 07/03/2022   Throat pain    Retropharyngeal abscess    Fracture, [place], left, closed 11/07/2020   HYPERLIPIDEMIA 01/01/2007   TOBACCO USER 01/01/2007   PCP:  Garlan Fillers, MD Pharmacy:   [id number] - [place], [place] - [address] AT CORNER OF [facility] [address] [place] [address] Phone: [phone] Fax: [phone]  [facility] [address] [place] [address] Phone: [phone] Fax: [phone]  [facility] - [place], [place] - [address] [place] [address] Phone: [phone] Fax: [phone]     Social Determinants of Health (SDOH) Social History: SDOH Screenings   Food Insecurity: No Food Insecurity (12/29/2022)  Housing: Low Risk  (12/29/2022)  Transportation Needs: No Transportation Needs (12/29/2022)  Utilities: Not At  Risk (12/29/2022)  Depression (PHQ2-9): Low Risk  (07/04/2022)  Tobacco Use: Medium Risk (12/29/2022)   SDOH Interventions:     Readmission Risk Interventions    03/27/2022   12:13 PM  Readmission Risk Prevention Plan  Post Dischage Appt Complete  Medication Screening Complete  Transportation Screening Complete

## 2022-12-31 NOTE — Progress Notes (Signed)
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 difficile Rx with Dificid as well as another recurrence occurring 07/01/2022--- saw Rexene Alberts with [facility] last 07/04/2022 and consideration was given at bedtime for VOWST if she was to have a third recurrence  Brought to the hospital for generalized weakness increasing confusion although was oriented to self and place on arrival Note mild productive cough persistent rhinorrhea for several days Tmax 102.7 tachycardic 115 tachypneic 25 CT scan nonacute CXR mild right basilar linear scarring atelectasis COVID-19 positive BUNs/creatinine 21/0.8 LFTs normal BNP 68.1 troponin 6 lactic acid 2.5   Plan  Toxic metabolic encephalopathy secondary to infection on admission Much improved-seems to be closer to baseline she does sundown a little bit-see below  Sepsis on admission?  COVID + on admission  Completed remdesivir-on discharge continue circumscribed steroids prednisone 50 mg until 01/02/2023 CRP relatively low and so is dimer--Duplex lower extremity 7/8 negative so would not search or treat further for thromboembolism  Volume depletion Start saline at 75 cc/H for 18 hours-repeat labs in a.m.  HTN Reported history of-not on any meds  UA pending-given 1 dose vancomycin 1 dose cefepime--- UA negative no further workup Discontinue all antibiotics given high risk of  recurrent C. difficile  Prior history recurrent C. Difficile-started on prophylactic vancomycin p.o. Has had recurrent C. difficile many times and in the outpatient setting has been considered for VOWST  no strong evidence for probiotics in these cases-follow-up [facility] outpatient We will discontinue all antibiotics as dictated above  Sensorineural hearing loss Moderately hard of hearing but seems stable  Mild cognitive deficits previously on Seroquel  DVT prophylaxis: Lovenox  Status is: Inpatient Remains inpatient appropriate because:   Symptomatic COVID    Subjective:  Mildly confused but pleasant can recognize husband remembers her son's name has no chest pain no fever Husband tells me she has not walked in over a year and he and the patient's son help her transfer from bed to chair Overall she seems better I have encouraged patient and husband to encourage oral p.o. We are starting fluids as she is not drinking enough--I have encouraged her and husband to use I-S  Objective + exam Vitals:   12/30/22 1222 12/30/22 1948 12/31/22 0445 12/31/22 0737  BP: (!) 144/83 118/64 (!) 142/88   Pulse: 78 93 79   Resp: 19 16 18    Temp: 97.8 F (36.6 C) 98 F (36.7 C) 97.6 F (36.4 C)   TempSrc: Oral Oral Oral   SpO2: 100% 98% 99% 99%  Weight:      Height:       Filed Weights   12/29/22 1756 12/29/22 2148  Weight: 52 kg 56.9 kg    Examination:   Pleasantly confused at times bitemporal wasting supraclavicular wasting arcus senilis present neck soft supple CTAB no added sound no rales no rhonchi Abdomen flat nontender no rebound No lower extremity edema  She does not desat when I take her off oxygen  Data Reviewed: reviewed   CBC    Component Value Date/Time   WBC 7.1 12/31/2022 0445   RBC 4.15 12/31/2022 0445   HGB 12.9 12/31/2022 0445   HCT 39.8 12/31/2022 0445   PLT 288 12/31/2022 0445   MCV 95.9 12/31/2022 0445   MCH 31.1 12/31/2022 0445   MCHC 32.4 12/31/2022  0445   RDW 12.7 12/31/2022 0445   LYMPHSABS 0.6 (L) 12/29/2022 1825   MONOABS 0.7 12/29/2022 1825   EOSABS 0.0 12/29/2022 1825   BASOSABS 0.0 12/29/2022 1825      Latest Ref Rng & Units 12/31/2022    4:45 AM 12/30/2022    5:40 AM 12/29/2022    6:25 PM  CMP  Glucose 70 - 99 mg/dL 130  865  784   BUN 8 - 23 mg/dL 25  16  21    Creatinine 0.44 - 1.00 mg/dL 6.96  2.95  2.84   Sodium 135 - 145 mmol/L 138  136  137   Potassium 3.5 - 5.1 mmol/L 4.6  4.0  3.8   Chloride 98 - 111 mmol/L 106  107  102   CO2 22 - 32 mmol/L 23  23  23    Calcium 8.9 - 10.3 mg/dL 8.9  8.7  9.2   Total Protein 6.5 - 8.1 g/dL 6.3  6.6  7.6   Total Bilirubin 0.3 - 1.2 mg/dL 0.3  0.4  0.8   Alkaline Phos 38 - 126 U/L 70  72  90   AST 15 - 41 U/L 19  16  17    ALT 0 - 44 U/L 13  12  11       Scheduled Meds:  benzonatate  200 mg Oral TID   cholecalciferol  400 Units Oral Daily   enoxaparin (LOVENOX) injection  40 mg Subcutaneous Q24H   folic acid  1 mg Oral Daily   methylPREDNISolone (SOLU-MEDROL) injection  0.5 mg/kg Intravenous Q12H   Followed by   Melene Muller ON 01/02/2023] predniSONE  50 mg Oral Daily   multivitamin with minerals  1 tablet Oral Daily   QUEtiapine  25 mg Oral QHS   saccharomyces boulardii  250 mg Oral BID   thiamine  100 mg Oral Daily   Continuous Infusions:  sodium chloride      Time  40  Rhetta Mura, MD  [facility]

## 2023-01-01 DIAGNOSIS — R4182 Altered mental status, unspecified: Secondary | ICD-10-CM | POA: Diagnosis not present

## 2023-01-01 LAB — CBC
HCT: 43.9 % (ref 36.0–46.0)
Hemoglobin: 14.1 g/dL (ref 12.0–15.0)
MCH: 31.1 pg (ref 26.0–34.0)
MCHC: 32.1 g/dL (ref 30.0–36.0)
MCV: 96.7 fL (ref 80.0–100.0)
Platelets: 337 10*3/uL (ref 150–400)
RBC: 4.54 MIL/uL (ref 3.87–5.11)
RDW: 12.9 % (ref 11.5–15.5)
WBC: 10.1 10*3/uL (ref 4.0–10.5)
nRBC: 0 % (ref 0.0–0.2)

## 2023-01-01 LAB — COMPREHENSIVE METABOLIC PANEL
ALT: 16 U/L (ref 0–44)
AST: 24 U/L (ref 15–41)
Albumin: 2.8 g/dL — ABNORMAL LOW (ref 3.5–5.0)
Alkaline Phosphatase: 70 U/L (ref 38–126)
Anion gap: 8 (ref 5–15)
BUN: 29 mg/dL — ABNORMAL HIGH (ref 8–23)
CO2: 23 mmol/L (ref 22–32)
Calcium: 8.3 mg/dL — ABNORMAL LOW (ref 8.9–10.3)
Chloride: 105 mmol/L (ref 98–111)
Creatinine, Ser: 0.71 mg/dL (ref 0.44–1.00)
GFR, Estimated: >60 mL/min (ref >60)
Glucose, Bld: 84 mg/dL (ref 70–99)
Potassium: 3.8 mmol/L (ref 3.5–5.1)
Sodium: 136 mmol/L (ref 135–145)
Total Bilirubin: 0.3 mg/dL (ref 0.3–1.2)
Total Protein: 6.1 g/dL — ABNORMAL LOW (ref 6.5–8.1)

## 2023-01-01 LAB — CULTURE, BLOOD (ROUTINE X 2)

## 2023-01-01 LAB — GLUCOSE, CAPILLARY: Glucose-Capillary: 198 mg/dL — ABNORMAL HIGH (ref 70–99)

## 2023-01-01 NOTE — Plan of Care (Signed)
  Problem: Respiratory: Goal: Will maintain a patent airway Outcome: Progressing   Problem: Elimination: Goal: Will not experience complications related to bowel motility Outcome: Progressing

## 2023-01-01 NOTE — Progress Notes (Signed)
PROGRESS NOTE    Alyssa Howard  ZOX:[id number] DOB: 1941/03/09 DOA: 12/29/2022 PCP: Garlan Fillers, MD  Brief Narrative: Alyssa Howard is a 82 y.o. female with medical history significant for hypertension, emphysema, former tobacco user, cervicalgia, kyphosis, history of osteoporosis with pathological fracture, history of treated peritonsillar abscess, history of C. difficile infection with recurrence 04/19/2022, 05/27/2022, 07/01/22, currently not taking any prescribed medications, who presents with worsening confusion for the past 3 days, waxing and waning at times.  Associated with generalized weakness, rhinorrhea, nonproductive cough, and subjective fevers.  Per the patient's husband at bedside, for the past 2 days, the patient has asked to go to the commode to urinate multiple times worse at night 3-4 times but with very minimal urine output.  Also with constipation and lower abdominal pain for the past 2 days.  Per her husband she has been unsteady on her feet for at least a year and today she was weaker in her legs and was unable to assist with shifting or transferring.  Required 2 persons assist to move her today, her husband and her son.  EMS was activated and the patient was brought into the ED for further evaluation.   In the ED, febrile with Tmax 102.7, tachycardic heart rate of 115, tachypneic respiratory of 25.  Noncontrast Head CT scan was nonacute.  Chest x-ray showing mild right basilar linear scarring and or atelectasis.  UA is pending.  COVID-19 screening test returned positive on 12/29/2022.  Due to concern for possible sepsis, peripheral blood cultures x 2 were obtained and the patient was started on broad-spectrum IV antibiotics until sepsis is ruled out.   ED Course: Tmax 102.7.  BP 148/114, pulse 123, respiratory rate 29, O2 saturation 95% on room air.  Lab studies notable for WBC 10.9, neutrophil count 9.4.  Lymphocyte count 0.6.  Serum glucose 157.  BUN 21, creatinine 0.82.   GFR greater than 60.  High-sensitivity troponin 6.  Lactic acid 2.5.  Assessment & Plan:   Principal Problem:   AMS (altered mental status)   Acute metabolic encephalopathy suspect secondary to COVID-19 viral infection versus others Delirium, with reported waxing and waning altered mental status-patient lives at home with her husband.  Per husband patient is bedbound and wheelchair-bound does not walk much in the house. CT head shows no acute abnormalities. UA no uti  Bilateral lower extremity edema Doppler shows no evidence of DVT  COVID-positive she was mildly hypoxic on admission which has resolved. D-dimer 1.84 Crp 3.3  Chest x-ray-Mildly decreased lung volumes with mild right basilar linear scarring and/or atelectasis.   Estimated body mass index is 19.65 kg/m as calculated from the following:   Height as of this encounter: 5\' 7"  (1.702 m).   Weight as of this encounter: 56.9 kg.  DVT prophylaxis: Lovenox Code Status: Full code Family Communication: Husband at bedside Disposition Plan:  Status is: Inpatient Remains inpatient appropriate because: Weakness COVID-positive   Consultants:  None  Procedures: None Antimicrobials: None  Subjective: She has no specific complaints husband at bedside she is trying to use incentive spirometer she does not appear to be in any distress  Objective: Vitals:   12/31/22 1551 12/31/22 1948 01/01/23 0328 01/01/23 1246  BP: 119/75 (!) 148/77 134/73 136/85  Pulse: 85 85 78 94  Resp: 18 18 14 16   Temp: 98 F (36.7 C) 97.7 F (36.5 C) 97.8 F (36.6 C) (!) 97.4 F (36.3 C)  TempSrc: Oral Oral Oral   SpO2:  98% 97% 96% 94%  Weight:      Height:        Intake/Output Summary (Last 24 hours) at 01/01/2023 1448 Last data filed at 01/01/2023 1448 Gross per 24 hour  Intake 1187.5 ml  Output 1875 ml  Net -687.5 ml   Filed Weights   12/29/22 1756 12/29/22 2148  Weight: 52 kg 56.9 kg    Examination:  General exam: Appears   ill-appearing Respiratory system: few rhonchi to auscultation. Respiratory effort normal. Cardiovascular system: S1 & S2 heard, RRR. No JVD, murmurs, rubs, gallops or clicks. No pedal edema. Gastrointestinal system: Abdomen is nondistended, soft and nontender. No organomegaly or masses felt. Normal bowel sounds heard. Central nervous system: Alert and oriented. No focal neurological deficits. Extremities: 1+ edema  Data Reviewed: I have personally reviewed following labs and imaging studies  CBC: Recent Labs  Lab 12/29/22 1825 12/30/22 0540 12/31/22 0445 01/01/23 0453  WBC 10.9* 6.0 7.1 10.1  NEUTROABS 9.4*  --   --   --   HGB 14.6 12.8 12.9 14.1  HCT 45.2 39.3 39.8 43.9  MCV 96.2 95.2 95.9 96.7  PLT 349 314 288 337   Basic Metabolic Panel: Recent Labs  Lab 12/29/22 1825 12/30/22 0540 12/31/22 0445 01/01/23 0453  NA 137 136 138 136  K 3.8 4.0 4.6 3.8  CL 102 107 106 105  CO2 23 23 23 23   GLUCOSE 157* 157* 142* 84  BUN 21 16 25* 29*  CREATININE 0.82 0.70 0.71 0.71  CALCIUM 9.2 8.7* 8.9 8.3*  MG  --  2.1  --   --   PHOS  --  4.2  --   --    GFR: Estimated Creatinine Clearance: 48.7 mL/min (by C-G formula based on SCr of 0.71 mg/dL). Liver Function Tests: Recent Labs  Lab 12/29/22 1825 12/30/22 0540 12/31/22 0445 01/01/23 0453  AST 17 16 19 24   ALT 11 12 13 16   ALKPHOS 90 72 70 70  BILITOT 0.8 0.4 0.3 0.3  PROT 7.6 6.6 6.3* 6.1*  ALBUMIN 3.7 3.0* 2.8* 2.8*   No results for input(s): "LIPASE", "AMYLASE" in the last 168 hours. No results for input(s): "AMMONIA" in the last 168 hours. Coagulation Profile: No results for input(s): "INR", "PROTIME" in the last 168 hours. Cardiac Enzymes: No results for input(s): "CKTOTAL", "CKMB", "CKMBINDEX", "TROPONINI" in the last 168 hours. BNP (last 3 results) No results for input(s): "PROBNP" in the last 8760 hours. HbA1C: Recent Labs    12/30/22 0540  HGBA1C 5.2   CBG: Recent Labs  Lab 12/30/22 0022  12/30/22 2136  GLUCAP 162* 144*   Lipid Profile: No results for input(s): "CHOL", "HDL", "LDLCALC", "TRIG", "CHOLHDL", "LDLDIRECT" in the last 72 hours. Thyroid Function Tests: No results for input(s): "TSH", "T4TOTAL", "FREET4", "T3FREE", "THYROIDAB" in the last 72 hours. Anemia Panel: No results for input(s): "VITAMINB12", "FOLATE", "FERRITIN", "TIBC", "IRON", "RETICCTPCT" in the last 72 hours. Sepsis Labs: Recent Labs  Lab 12/29/22 1825 12/29/22 1957  LATICACIDVEN 2.5* 1.8    Recent Results (from the past 240 hour(s))  SARS Coronavirus 2 by RT PCR (hospital order, performed in [facility] hospital lab) *cepheid single result test* Anterior Nasal Swab     Status: Abnormal   Collection Time: 12/29/22  6:03 PM   Specimen: Anterior Nasal Swab  Result Value Ref Range Status   SARS Coronavirus 2 by RT PCR POSITIVE (A) NEGATIVE Final    Comment: (NOTE) SARS-CoV-2 target nucleic acids are DETECTED  SARS-CoV-2 RNA  is generally detectable in upper respiratory specimens  during the acute phase of infection.  Positive results are indicative  of the presence of the identified virus, but do not rule out bacterial infection or co-infection with other pathogens not detected by the test.  Clinical correlation with patient history and  other diagnostic information is necessary to determine patient infection status.  The expected result is negative.  Fact Sheet for Patients:   RoadLapTop.co.za   Fact Sheet for Healthcare Providers:   http://kim-miller.com/    This test is not yet approved or cleared by the [place] FDA and  has been authorized for detection and/or diagnosis of SARS-CoV-2 by FDA under an Emergency Use Authorization (EUA).  This EUA will remain in effect (meaning this test can be used) for the duration of  the COVID-19 declaration under Section 564(b)(1)  of the Act, 21 U.S.C. section 360-bbb-3(b)(1), unless the  authorization is terminated or revoked sooner.   Performed at [facility], [address] [place], [place] [address]   Culture, blood (routine x 2)     Status: Abnormal   Collection Time: 12/29/22  6:25 PM   Specimen: BLOOD  Result Value Ref Range Status   Specimen Description BLOOD SITE NOT SPECIFIED  Final   Special Requests   Final    BOTTLES DRAWN AEROBIC AND ANAEROBIC Blood Culture results may not be optimal due to an inadequate volume of blood received in culture bottles   Culture  Setup Time   Final    GRAM POSITIVE COCCI AEROBIC BOTTLE ONLY CRITICAL RESULT CALLED TO, READ BACK BY AND VERIFIED WITH: PHARMD JUSTIN LEGGE ON 12/30/22 @ 1914 BY DRT    Culture (A)  Final    STAPHYLOCOCCUS WARNERI THE SIGNIFICANCE OF ISOLATING THIS ORGANISM FROM A SINGLE SET OF BLOOD CULTURES WHEN MULTIPLE SETS ARE DRAWN IS UNCERTAIN. PLEASE NOTIFY THE MICROBIOLOGY DEPARTMENT WITHIN ONE WEEK IF SPECIATION AND SENSITIVITIES ARE REQUIRED. Performed at [facility], [address] [place], [place] [address]    Report Status 01/01/2023 FINAL  Final  Blood Culture ID Panel (Reflexed)     Status: Abnormal   Collection Time: 12/29/22  6:25 PM  Result Value Ref Range Status   Enterococcus faecalis NOT DETECTED NOT DETECTED Final   Enterococcus Faecium NOT DETECTED NOT DETECTED Final   Listeria monocytogenes NOT DETECTED NOT DETECTED Final   Staphylococcus species DETECTED (A) NOT DETECTED Final    Comment: CRITICAL RESULT CALLED TO, READ BACK BY AND VERIFIED WITH: PHARMD JUSTIN LEGGE ON 12/30/22 @ 1914 BY DRT    Staphylococcus aureus (BCID) NOT DETECTED NOT DETECTED Final   Staphylococcus epidermidis NOT DETECTED NOT DETECTED Final   Staphylococcus lugdunensis NOT DETECTED NOT DETECTED Final   Streptococcus species NOT DETECTED NOT DETECTED Final   Streptococcus agalactiae NOT DETECTED NOT DETECTED Final   Streptococcus pneumoniae NOT DETECTED NOT DETECTED Final    Streptococcus pyogenes NOT DETECTED NOT DETECTED Final   A.calcoaceticus-baumannii NOT DETECTED NOT DETECTED Final   Bacteroides fragilis NOT DETECTED NOT DETECTED Final   Enterobacterales NOT DETECTED NOT DETECTED Final   Enterobacter cloacae complex NOT DETECTED NOT DETECTED Final   Escherichia coli NOT DETECTED NOT DETECTED Final   Klebsiella aerogenes NOT DETECTED NOT DETECTED Final   Klebsiella oxytoca NOT DETECTED NOT DETECTED Final   Klebsiella pneumoniae NOT DETECTED NOT DETECTED Final   Proteus species NOT DETECTED NOT DETECTED Final   Salmonella species NOT DETECTED NOT DETECTED Final   Serratia marcescens  NOT DETECTED NOT DETECTED Final   Haemophilus influenzae NOT DETECTED NOT DETECTED Final   Neisseria meningitidis NOT DETECTED NOT DETECTED Final   Pseudomonas aeruginosa NOT DETECTED NOT DETECTED Final   Stenotrophomonas maltophilia NOT DETECTED NOT DETECTED Final   Candida albicans NOT DETECTED NOT DETECTED Final   Candida auris NOT DETECTED NOT DETECTED Final   Candida glabrata NOT DETECTED NOT DETECTED Final   Candida krusei NOT DETECTED NOT DETECTED Final   Candida parapsilosis NOT DETECTED NOT DETECTED Final   Candida tropicalis NOT DETECTED NOT DETECTED Final   Cryptococcus neoformans/gattii NOT DETECTED NOT DETECTED Final    Comment: Performed at [facility], [address] [place], [place] [address]  Culture, blood (routine x 2)     Status: None (Preliminary result)   Collection Time: 12/29/22  6:30 PM   Specimen: BLOOD LEFT WRIST  Result Value Ref Range Status   Specimen Description   Final    BLOOD LEFT WRIST Performed at [facility], [address], [place], [place] [address]    Special Requests   Final    Blood Culture results may not be optimal due to an inadequate volume of blood received in culture bottles BOTTLES DRAWN AEROBIC AND ANAEROBIC Performed at [facility], [address] [place], [place] [address]     Culture   Final    NO GROWTH 3 DAYS Performed at [facility], [address] [place], [place] [address]    Report Status PENDING  Incomplete     Radiology Studies: No results found.   Scheduled Meds:  benzonatate  200 mg Oral TID   cholecalciferol  400 Units Oral Daily   enoxaparin (LOVENOX) injection  40 mg Subcutaneous Q24H   folic acid  1 mg Oral Daily   multivitamin with minerals  1 tablet Oral Daily   [START ON 01/02/2023] predniSONE  50 mg Oral Daily   QUEtiapine  25 mg Oral QHS   saccharomyces boulardii  250 mg Oral BID   thiamine  100 mg Oral Daily   Continuous Infusions:  sodium chloride 75 mL/hr at 01/01/23 1133     LOS: 3 days    Time spent: 37 min Alwyn Ren, MD  01/01/2023, 2:48 PM

## 2023-01-02 DIAGNOSIS — R509 Fever, unspecified: Secondary | ICD-10-CM

## 2023-01-02 LAB — COMPREHENSIVE METABOLIC PANEL
ALT: 17 U/L (ref 0–44)
AST: 21 U/L (ref 15–41)
Albumin: 3.2 g/dL — ABNORMAL LOW (ref 3.5–5.0)
Alkaline Phosphatase: 79 U/L (ref 38–126)
Anion gap: 7 (ref 5–15)
BUN: 19 mg/dL (ref 8–23)
CO2: 27 mmol/L (ref 22–32)
Calcium: 8.8 mg/dL — ABNORMAL LOW (ref 8.9–10.3)
Chloride: 104 mmol/L (ref 98–111)
Creatinine, Ser: 0.75 mg/dL (ref 0.44–1.00)
GFR, Estimated: >60 mL/min (ref >60)
Glucose, Bld: 118 mg/dL — ABNORMAL HIGH (ref 70–99)
Potassium: 4 mmol/L (ref 3.5–5.1)
Sodium: 138 mmol/L (ref 135–145)
Total Bilirubin: 0.2 mg/dL — ABNORMAL LOW (ref 0.3–1.2)
Total Protein: 6.6 g/dL (ref 6.5–8.1)

## 2023-01-02 LAB — CBC
HCT: 44.5 % (ref 36.0–46.0)
Hemoglobin: 14.7 g/dL (ref 12.0–15.0)
MCH: 31.5 pg (ref 26.0–34.0)
MCHC: 33 g/dL (ref 30.0–36.0)
MCV: 95.5 fL (ref 80.0–100.0)
Platelets: 381 10*3/uL (ref 150–400)
RBC: 4.66 MIL/uL (ref 3.87–5.11)
RDW: 12.6 % (ref 11.5–15.5)
WBC: 8.3 10*3/uL (ref 4.0–10.5)
nRBC: 0 % (ref 0.0–0.2)

## 2023-01-02 MED ORDER — ACETAMINOPHEN 325 MG PO TABS: 650.0000 mg | ORAL_TABLET | Freq: Four times a day (QID) | ORAL | Status: AC | PRN

## 2023-01-02 MED ORDER — ADULT MULTIVITAMIN W/MINERALS CH: 1.0000 | ORAL_TABLET | Freq: Every day | ORAL | Status: AC

## 2023-01-02 MED ORDER — VITAMIN B-1 100 MG PO TABS: 100.0000 mg | ORAL_TABLET | Freq: Every day | ORAL | Status: AC

## 2023-01-02 MED ORDER — CHOLECALCIFEROL 10 MCG (400 UNIT) PO TABS: 400.0000 [IU] | ORAL_TABLET | Freq: Every day | ORAL | 0 refills | Status: AC

## 2023-01-02 MED ORDER — POLYETHYLENE GLYCOL 3350 17 G PO PACK: 17.0000 g | PACK | Freq: Every day | ORAL | 0 refills | Status: AC | PRN

## 2023-01-02 MED ORDER — PREDNISONE 50 MG PO TABS: ORAL_TABLET | ORAL | 0 refills | Status: AC

## 2023-01-02 NOTE — Progress Notes (Signed)
Pt being d/c.  PIVs removed Discharge information given to pt and husband Pt and husband verb understanding and had no further questions  Wheelchair provided and taken down stairs.

## 2023-01-02 NOTE — Discharge Summary (Signed)
Physician Discharge Summary  Alyssa Howard ZOX:[id number] DOB: 05-11-1941 DOA: 12/29/2022  PCP: Garlan Fillers, MD  Admit date: 12/29/2022 Discharge date: 01/02/2023  Admitted From: Home Disposition: Home Recommendations for Outpatient Follow-up:  Follow up with PCP in 1-2 weeks Please obtain BMP/CBC in one week  Home Health: Yes Equipment/Devices: None Discharge Condition: Stable CODE STATUS: Full code Diet recommendation: Cardiac Brief/Interim Summary:  Alyssa Howard is a 82 y.o. female with medical history significant for hypertension, emphysema, former tobacco user, cervicalgia, kyphosis, history of osteoporosis with pathological fracture, history of treated peritonsillar abscess, history of C. difficile infection with recurrence 04/19/2022, 05/27/2022, 07/01/22, currently not taking any prescribed medications, who presents with worsening confusion for the past 3 days, waxing and waning at times.  Associated with generalized weakness, rhinorrhea, nonproductive cough, and subjective fevers.  Per the patient's husband at bedside, for the past 2 days, the patient has asked to go to the commode to urinate multiple times worse at night 3-4 times but with very minimal urine output.  Also with constipation and lower abdominal pain for the past 2 days.  Per her husband she has been unsteady on her feet for at least a year and today she was weaker in her legs and was unable to assist with shifting or transferring.  Required 2 persons assist to move her today, her husband and her son.  EMS was activated and the patient was brought into the ED for further evaluation.   In the ED, febrile with Tmax 102.7, tachycardic heart rate of 115, tachypneic respiratory of 25.  Noncontrast Head CT scan was nonacute.  Chest x-ray showing mild right basilar linear scarring and or atelectasis.  UA is pending.  COVID-19 screening test returned positive on 12/29/2022.  Due to concern for possible sepsis, peripheral  blood cultures x 2 were obtained and the patient was started on broad-spectrum IV antibiotics until sepsis is ruled out.   ED Course: Tmax 102.7.  BP 148/114, pulse 123, respiratory rate 29, O2 saturation 95% on room air.  Lab studies notable for WBC 10.9, neutrophil count 9.4.  Lymphocyte count 0.6.  Serum glucose 157.  BUN 21, creatinine 0.82.  GFR greater than 60.  High-sensitivity troponin 6.  Lactic acid 2.5.   Discharge Diagnoses:  Principal Problem:   AMS (altered mental status)    Acute metabolic encephalopathy suspect secondary to COVID-19 viral infection versus others Delirium, with reported waxing and waning altered mental status-patient lives at home with her husband.  Per husband patient is bedbound and wheelchair-bound does not walk much in the house. CT head shows no acute abnormalities. UA no uti   Bilateral lower extremity edema Doppler shows no evidence of DVT   COVID-positive she was mildly hypoxic on admission which has resolved.she was treated symptomatically.  On the day of discharge she was on room air.  Chest x-ray-Mildly decreased lung volumes with mild right basilar linear scarring and/or atelectasis.  Encourage her to continue using incentive spirometer at home.  Estimated body mass index is 19.65 kg/m as calculated from the following:   Height as of this encounter: 5\' 7"  (1.702 m).   Weight as of this encounter: 56.9 kg.  Discharge Instructions  Discharge Instructions     Diet - low sodium heart healthy   Complete by: As directed    Increase activity slowly   Complete by: As directed       Allergies as of 01/02/2023       Reactions  Albuterol Sulfate    Other reaction(s): rash in mouth,   Aspirin Nausea Only   Codeine Nausea Only   Dorzolamide Hcl-timolol Mal Other (See Comments)   Penicillin G Sodium    Other reaction(s): she had a rash with use of augmentin   Penicillins Other (See Comments)   Pt's son states she is not allergic to  penicillin Tolerated ceftriaxone (2018)    Statins    Other reaction(s): Intolerance (side effects from meds)   Zetia [ezetimibe] Other (See Comments)   Muscle deterioration         Medication List     STOP taking these medications    oxyCODONE-acetaminophen 5-325 MG tablet Commonly known as: Percocet       TAKE these medications    acetaminophen 325 MG tablet Commonly known as: TYLENOL Take 2 tablets (650 mg total) by mouth every 6 (six) hours as needed for mild pain, fever or headache.   cholecalciferol 10 MCG (400 UNIT) Tabs tablet Commonly known as: VITAMIN D3 Take 1 tablet (400 Units total) by mouth daily.   multivitamin with minerals Tabs tablet Take 1 tablet by mouth daily.   polyethylene glycol 17 g packet Commonly known as: MIRALAX / GLYCOLAX Take 17 g by mouth daily as needed for mild constipation.   predniSONE 50 MG tablet Commonly known as: DELTASONE Take 50 mg 7/12 and 7/13 then stop   QUEtiapine 25 MG tablet Commonly known as: SEROQUEL Take 25 mg by mouth at bedtime.   thiamine 100 MG tablet Commonly known as: Vitamin B-1 Take 1 tablet (100 mg total) by mouth daily.        Allergies  Allergen Reactions   Albuterol Sulfate     Other reaction(s): rash in mouth,   Aspirin Nausea Only   Codeine Nausea Only   Dorzolamide Hcl-Timolol Mal Other (See Comments)   Penicillin G Sodium     Other reaction(s): she had a rash with use of augmentin   Penicillins Other (See Comments)    Pt's son states she is not allergic to penicillin  Tolerated ceftriaxone (2018)    Statins     Other reaction(s): Intolerance (side effects from meds)   Zetia [Ezetimibe] Other (See Comments)    Muscle deterioration     Consultations: none   Procedures/Studies: VAS [place] LOWER EXTREMITY VENOUS (DVT)  Result Date: 12/30/2022  Lower Venous DVT Study Patient Name:  Alyssa Howard  Date of Exam:   12/30/2022 Medical Rec #: [id number]         Accession #:     [id number] Date of Birth: [birth year]        Patient Gender: F Patient Age:   82 years Exam Location:  [facility] Procedure:      VAS [place] LOWER EXTREMITY VENOUS (DVT) Referring Phys: Enid Derry HALL --------------------------------------------------------------------------------  Indications: Edema, and Covid.  Limitations: Positioning and body habitus. Comparison Study: No prior study on file Performing Technologist: Sherren Kerns RVS  Examination Guidelines: A complete evaluation includes B-mode imaging, spectral Doppler, color Doppler, and power Doppler as needed of all accessible portions of each vessel. Bilateral testing is considered an integral part of a complete examination. Limited examinations for reoccurring indications may be performed as noted. The reflux portion of the exam is performed with the patient in reverse Trendelenburg.  +---------+---------------+---------+-----------+---------------+--------------+ RIGHT    CompressibilityPhasicitySpontaneityProperties     Thrombus Aging +---------+---------------+---------+-----------+---------------+--------------+ CFV      Full  pulsatile                                                                 waveforms                     +---------+---------------+---------+-----------+---------------+--------------+ SFJ      Full                                                             +---------+---------------+---------+-----------+---------------+--------------+ FV Prox  Full                                                             +---------+---------------+---------+-----------+---------------+--------------+ FV Mid   Full                                                             +---------+---------------+---------+-----------+---------------+--------------+ FV DistalFull                                                              +---------+---------------+---------+-----------+---------------+--------------+ PFV      Full                                                             +---------+---------------+---------+-----------+---------------+--------------+ POP                                         pulsatile      patent by                                                  waveforms      color and                                                                 Doppler        +---------+---------------+---------+-----------+---------------+--------------+ PTV  Not well                                                                  visualized     +---------+---------------+---------+-----------+---------------+--------------+ PERO                                                       Not well                                                                  visualized     +---------+---------------+---------+-----------+---------------+--------------+   +---------+---------------+---------+-----------+---------------+--------------+ LEFT     CompressibilityPhasicitySpontaneityProperties     Thrombus Aging +---------+---------------+---------+-----------+---------------+--------------+ CFV      Full                               pulsatile                                                                 waveforms                     +---------+---------------+---------+-----------+---------------+--------------+ SFJ      Full                                                             +---------+---------------+---------+-----------+---------------+--------------+ FV Prox  Full                                                             +---------+---------------+---------+-----------+---------------+--------------+ FV Mid   Full                               pulsatile                                                                  waveforms                     +---------+---------------+---------+-----------+---------------+--------------+ FV DistalFull                                                             +---------+---------------+---------+-----------+---------------+--------------+  PFV      Full                                                             +---------+---------------+---------+-----------+---------------+--------------+ POP      Full                               pulsatile                                                                 waveforms                     +---------+---------------+---------+-----------+---------------+--------------+ PTV                                                        Not well                                                                  visualized     +---------+---------------+---------+-----------+---------------+--------------+ PERO                                                       Not well                                                                  visualized     +---------+---------------+---------+-----------+---------------+--------------+     Summary: BILATERAL: -No evidence of popliteal cyst, bilaterally. RIGHT: - There is no evidence of deep vein thrombosis in the lower extremity. However, portions of this examination were limited- see technologist comments above.  LEFT: - There is no evidence of deep vein thrombosis in the lower extremity. However, portions of this examination were limited- see technologist comments above.  *See table(s) above for measurements and observations. Electronically signed by Gerarda Fraction on 12/30/2022 at 3:37:02 PM.    Final    DG Chest Port 1 View  Result Date: 12/29/2022 CLINICAL DATA:  Shortness of breath and constipation with weakness. EXAM: PORTABLE CHEST 1 VIEW COMPARISON:  March 24, 2022 FINDINGS: The heart size and  mediastinal contours are within normal limits. Mildly decreased lung volumes are seen with mild elevation of the right hemidiaphragm. Mild, diffuse, chronic appearing increased interstitial lung markings are  also noted. Mild right basilar linear scarring and/or atelectasis is seen. There is no evidence of a pleural effusion or pneumothorax. Multilevel degenerative changes are seen throughout the thoracic spine. IMPRESSION: Mildly decreased lung volumes with mild right basilar linear scarring and/or atelectasis. Electronically Signed   By: Aram Candela M.D.   On: 12/29/2022 19:27   CT Head Wo Contrast  Result Date: 12/29/2022 CLINICAL DATA:  Altered mental status EXAM: CT HEAD WITHOUT CONTRAST TECHNIQUE: Contiguous axial images were obtained from the base of the skull through the vertex without intravenous contrast. RADIATION DOSE REDUCTION: This exam was performed according to the departmental dose-optimization program which includes automated exposure control, adjustment of the mA and/or kV according to patient size and/or use of iterative reconstruction technique. COMPARISON:  11/15/2021 FINDINGS: Brain: There is no mass, hemorrhage or extra-axial collection. The size and configuration of the ventricles and extra-axial CSF spaces are normal. There is hypoattenuation of the white matter, most commonly indicating chronic small vessel disease. Vascular: No abnormal hyperdensity of the major intracranial arteries or dural venous sinuses. No intracranial atherosclerosis. Skull: The visualized skull base, calvarium and extracranial soft tissues are normal. Sinuses/Orbits: No fluid levels or advanced mucosal thickening of the visualized paranasal sinuses. No mastoid or middle ear effusion. The orbits are normal. IMPRESSION: Chronic small vessel disease without acute intracranial abnormality. Electronically Signed   By: Deatra Robinson M.D.   On: 12/29/2022 19:17   (Echo, Carotid, EGD, Colonoscopy, ERCP)     Subjective: Resting in bed anxious to go home no complaints  Discharge Exam: Vitals:   01/01/23 2041 01/02/23 0518  BP: (!) 131/90 (!) 149/89  Pulse: 89 73  Resp: 16 18  Temp: 98.7 F (37.1 C) 97.7 F (36.5 C)  SpO2: 96% 94%   Vitals:   01/01/23 0328 01/01/23 1246 01/01/23 2041 01/02/23 0518  BP: 134/73 136/85 (!) 131/90 (!) 149/89  Pulse: 78 94 89 73  Resp: 14 16 16 18   Temp: 97.8 F (36.6 C) (!) 97.4 F (36.3 C) 98.7 F (37.1 C) 97.7 F (36.5 C)  TempSrc: Oral  Oral Oral  SpO2: 96% 94% 96% 94%  Weight:      Height:        General: Pt is alert, awake, not in acute distress Cardiovascular: RRR, S1/S2 +, no rubs, no gallops Respiratory: CTA bilaterally, no wheezing, no rhonchi Abdominal: Soft, NT, ND, bowel sounds + Extremities: no edema, no cyanosis    The results of significant diagnostics from this hospitalization (including imaging, microbiology, ancillary and laboratory) are listed below for reference.     Microbiology: Recent Results (from the past 240 hour(s))  SARS Coronavirus 2 by RT PCR (hospital order, performed in [facility] hospital lab) *cepheid single result test* Anterior Nasal Swab     Status: Abnormal   Collection Time: 12/29/22  6:03 PM   Specimen: Anterior Nasal Swab  Result Value Ref Range Status   SARS Coronavirus 2 by RT PCR POSITIVE (A) NEGATIVE Final    Comment: (NOTE) SARS-CoV-2 target nucleic acids are DETECTED  SARS-CoV-2 RNA is generally detectable in upper respiratory specimens  during the acute phase of infection.  Positive results are indicative  of the presence of the identified virus, but do not rule out bacterial infection or co-infection with other pathogens not detected by the test.  Clinical correlation with patient history and  other diagnostic information is necessary to determine patient infection status.  The expected result is negative.  Fact Sheet for Patients:  RoadLapTop.co.za   Fact Sheet for Healthcare Providers:   http://kim-miller.com/    This test is not yet approved or cleared by the [place] FDA and  has been authorized for detection and/or diagnosis of SARS-CoV-2 by FDA under an Emergency Use Authorization (EUA).  This EUA will remain in effect (meaning this test can be used) for the duration of  the COVID-19 declaration under Section 564(b)(1)  of the Act, 21 U.S.C. section 360-bbb-3(b)(1), unless the authorization is terminated or revoked sooner.   Performed at [facility], [address], [place], [place] [address]   Culture, blood (routine x 2)     Status: Abnormal   Collection Time: 12/29/22  6:25 PM   Specimen: BLOOD  Result Value Ref Range Status   Specimen Description BLOOD SITE NOT SPECIFIED  Final   Special Requests   Final    BOTTLES DRAWN AEROBIC AND ANAEROBIC Blood Culture results may not be optimal due to an inadequate volume of blood received in culture bottles   Culture  Setup Time   Final    GRAM POSITIVE COCCI AEROBIC BOTTLE ONLY CRITICAL RESULT CALLED TO, READ BACK BY AND VERIFIED WITH: PHARMD JUSTIN LEGGE ON 12/30/22 @ 1914 BY DRT    Culture (A)  Final    STAPHYLOCOCCUS WARNERI THE SIGNIFICANCE OF ISOLATING THIS ORGANISM FROM A SINGLE SET OF BLOOD CULTURES WHEN MULTIPLE SETS ARE DRAWN IS UNCERTAIN. PLEASE NOTIFY THE MICROBIOLOGY DEPARTMENT WITHIN ONE WEEK IF SPECIATION AND SENSITIVITIES ARE REQUIRED. Performed at [facility], [address], [place], [place] [address]    Report Status 01/01/2023 FINAL  Final  Blood Culture ID Panel (Reflexed)     Status: Abnormal   Collection Time: 12/29/22  6:25 PM  Result Value Ref Range Status   Enterococcus faecalis NOT DETECTED NOT DETECTED Final   Enterococcus Faecium NOT DETECTED NOT DETECTED Final   Listeria monocytogenes NOT DETECTED NOT DETECTED Final   Staphylococcus species DETECTED  (A) NOT DETECTED Final    Comment: CRITICAL RESULT CALLED TO, READ BACK BY AND VERIFIED WITH: PHARMD JUSTIN LEGGE ON 12/30/22 @ 1914 BY DRT    Staphylococcus aureus (BCID) NOT DETECTED NOT DETECTED Final   Staphylococcus epidermidis NOT DETECTED NOT DETECTED Final   Staphylococcus lugdunensis NOT DETECTED NOT DETECTED Final   Streptococcus species NOT DETECTED NOT DETECTED Final   Streptococcus agalactiae NOT DETECTED NOT DETECTED Final   Streptococcus pneumoniae NOT DETECTED NOT DETECTED Final   Streptococcus pyogenes NOT DETECTED NOT DETECTED Final   A.calcoaceticus-baumannii NOT DETECTED NOT DETECTED Final   Bacteroides fragilis NOT DETECTED NOT DETECTED Final   Enterobacterales NOT DETECTED NOT DETECTED Final   Enterobacter cloacae complex NOT DETECTED NOT DETECTED Final   Escherichia coli NOT DETECTED NOT DETECTED Final   Klebsiella aerogenes NOT DETECTED NOT DETECTED Final   Klebsiella oxytoca NOT DETECTED NOT DETECTED Final   Klebsiella pneumoniae NOT DETECTED NOT DETECTED Final   Proteus species NOT DETECTED NOT DETECTED Final   Salmonella species NOT DETECTED NOT DETECTED Final   Serratia marcescens NOT DETECTED NOT DETECTED Final   Haemophilus influenzae NOT DETECTED NOT DETECTED Final   Neisseria meningitidis NOT DETECTED NOT DETECTED Final   Pseudomonas aeruginosa NOT DETECTED NOT DETECTED Final   Stenotrophomonas maltophilia NOT DETECTED NOT DETECTED Final   Candida albicans NOT DETECTED NOT DETECTED Final   Candida auris NOT DETECTED NOT DETECTED Final   Candida glabrata NOT DETECTED NOT DETECTED Final   Candida krusei NOT DETECTED NOT DETECTED  Final   Candida parapsilosis NOT DETECTED NOT DETECTED Final   Candida tropicalis NOT DETECTED NOT DETECTED Final   Cryptococcus neoformans/gattii NOT DETECTED NOT DETECTED Final    Comment: Performed at [facility], [address] [place], [place] [address]  Culture, blood (routine x 2)     Status: None (Preliminary  result)   Collection Time: 12/29/22  6:30 PM   Specimen: BLOOD LEFT WRIST  Result Value Ref Range Status   Specimen Description   Final    BLOOD LEFT WRIST Performed at [facility], [address] [place], [place] [address]    Special Requests   Final    Blood Culture results may not be optimal due to an inadequate volume of blood received in culture bottles BOTTLES DRAWN AEROBIC AND ANAEROBIC Performed at [facility], [address], [place], [place] [address]    Culture   Final    NO GROWTH 4 DAYS Performed at [facility], [address], [place], [place] [address]    Report Status PENDING  Incomplete     Labs: BNP (last 3 results) Recent Labs    12/29/22 1825  BNP 68.1   Basic Metabolic Panel: Recent Labs  Lab 12/29/22 1825 12/30/22 0540 12/31/22 0445 01/01/23 0453 01/02/23 0524  NA 137 136 138 136 138  K 3.8 4.0 4.6 3.8 4.0  CL 102 107 106 105 104  CO2 23 23 23 23 27   GLUCOSE 157* 157* 142* 84 118*  BUN 21 16 25* 29* 19  CREATININE 0.82 0.70 0.71 0.71 0.75  CALCIUM 9.2 8.7* 8.9 8.3* 8.8*  MG  --  2.1  --   --   --   PHOS  --  4.2  --   --   --    Liver Function Tests: Recent Labs  Lab 12/29/22 1825 12/30/22 0540 12/31/22 0445 01/01/23 0453 01/02/23 0524  AST 17 16 19 24 21   ALT 11 12 13 16 17   ALKPHOS 90 72 70 70 79  BILITOT 0.8 0.4 0.3 0.3 0.2*  PROT 7.6 6.6 6.3* 6.1* 6.6  ALBUMIN 3.7 3.0* 2.8* 2.8* 3.2*   No results for input(s): "LIPASE", "AMYLASE" in the last 168 hours. No results for input(s): "AMMONIA" in the last 168 hours. CBC: Recent Labs  Lab 12/29/22 1825 12/30/22 0540 12/31/22 0445 01/01/23 0453 01/02/23 0524  WBC 10.9* 6.0 7.1 10.1 8.3  NEUTROABS 9.4*  --   --   --   --   HGB 14.6 12.8 12.9 14.1 14.7  HCT 45.2 39.3 39.8 43.9 44.5  MCV 96.2 95.2 95.9 96.7 95.5  PLT 349 314 288 337 381   Cardiac Enzymes: No results for input(s): "CKTOTAL", "CKMB", "CKMBINDEX", "TROPONINI" in the last 168  hours. BNP: Invalid input(s): "POCBNP" CBG: Recent Labs  Lab 12/30/22 0022 12/30/22 2136 01/01/23 2124  GLUCAP 162* 144* 198*   D-Dimer Recent Labs    12/31/22 0445  DDIMER 1.84*   Hgb A1c No results for input(s): "HGBA1C" in the last 72 hours. Lipid Profile No results for input(s): "CHOL", "HDL", "LDLCALC", "TRIG", "CHOLHDL", "LDLDIRECT" in the last 72 hours. Thyroid function studies No results for input(s): "TSH", "T4TOTAL", "T3FREE", "THYROIDAB" in the last 72 hours.  Invalid input(s): "FREET3" Anemia work up No results for input(s): "VITAMINB12", "FOLATE", "FERRITIN", "TIBC", "IRON", "RETICCTPCT" in the last 72 hours. Urinalysis    Component Value Date/Time   COLORURINE YELLOW 12/30/2022 0636   APPEARANCEUR HAZY (A) 12/30/2022  0636   LABSPEC 1.017 12/30/2022 0636   PHURINE 5.0 12/30/2022 0636   GLUCOSEU NEGATIVE 12/30/2022 0636   HGBUR SMALL (A) 12/30/2022 0636   BILIRUBINUR NEGATIVE 12/30/2022 0636   KETONESUR NEGATIVE 12/30/2022 0636   PROTEINUR NEGATIVE 12/30/2022 0636   UROBILINOGEN 0.2 08/08/2009 1229   NITRITE NEGATIVE 12/30/2022 0636   LEUKOCYTESUR NEGATIVE 12/30/2022 0636   Sepsis Labs Recent Labs  Lab 12/30/22 0540 12/31/22 0445 01/01/23 0453 01/02/23 0524  WBC 6.0 7.1 10.1 8.3   Microbiology Recent Results (from the past 240 hour(s))  SARS Coronavirus 2 by RT PCR (hospital order, performed in [facility] hospital lab) *cepheid single result test* Anterior Nasal Swab     Status: Abnormal   Collection Time: 12/29/22  6:03 PM   Specimen: Anterior Nasal Swab  Result Value Ref Range Status   SARS Coronavirus 2 by RT PCR POSITIVE (A) NEGATIVE Final    Comment: (NOTE) SARS-CoV-2 target nucleic acids are DETECTED  SARS-CoV-2 RNA is generally detectable in upper respiratory specimens  during the acute phase of infection.  Positive results are indicative  of the presence of the identified virus, but do not rule out bacterial infection or  co-infection with other pathogens not detected by the test.  Clinical correlation with patient history and  other diagnostic information is necessary to determine patient infection status.  The expected result is negative.  Fact Sheet for Patients:   RoadLapTop.co.za   Fact Sheet for Healthcare Providers:   http://kim-miller.com/    This test is not yet approved or cleared by the [place] FDA and  has been authorized for detection and/or diagnosis of SARS-CoV-2 by FDA under an Emergency Use Authorization (EUA).  This EUA will remain in effect (meaning this test can be used) for the duration of  the COVID-19 declaration under Section 564(b)(1)  of the Act, 21 U.S.C. section 360-bbb-3(b)(1), unless the authorization is terminated or revoked sooner.   Performed at [facility], [address] [place], [place] [address]   Culture, blood (routine x 2)     Status: Abnormal   Collection Time: 12/29/22  6:25 PM   Specimen: BLOOD  Result Value Ref Range Status   Specimen Description BLOOD SITE NOT SPECIFIED  Final   Special Requests   Final    BOTTLES DRAWN AEROBIC AND ANAEROBIC Blood Culture results may not be optimal due to an inadequate volume of blood received in culture bottles   Culture  Setup Time   Final    GRAM POSITIVE COCCI AEROBIC BOTTLE ONLY CRITICAL RESULT CALLED TO, READ BACK BY AND VERIFIED WITH: PHARMD JUSTIN LEGGE ON 12/30/22 @ 1914 BY DRT    Culture (A)  Final    STAPHYLOCOCCUS WARNERI THE SIGNIFICANCE OF ISOLATING THIS ORGANISM FROM A SINGLE SET OF BLOOD CULTURES WHEN MULTIPLE SETS ARE DRAWN IS UNCERTAIN. PLEASE NOTIFY THE MICROBIOLOGY DEPARTMENT WITHIN ONE WEEK IF SPECIATION AND SENSITIVITIES ARE REQUIRED. Performed at [facility], [address] [place], [place] [address]    Report Status 01/01/2023 FINAL  Final  Blood Culture ID Panel (Reflexed)     Status: Abnormal   Collection Time:  12/29/22  6:25 PM  Result Value Ref Range Status   Enterococcus faecalis NOT DETECTED NOT DETECTED Final   Enterococcus Faecium NOT DETECTED NOT DETECTED Final   Listeria monocytogenes NOT DETECTED NOT DETECTED Final   Staphylococcus species DETECTED (A) NOT DETECTED Final    Comment: CRITICAL RESULT CALLED TO, READ BACK BY AND VERIFIED  WITH: PHARMD JUSTIN LEGGE ON 12/30/22 @ 1914 BY DRT    Staphylococcus aureus (BCID) NOT DETECTED NOT DETECTED Final   Staphylococcus epidermidis NOT DETECTED NOT DETECTED Final   Staphylococcus lugdunensis NOT DETECTED NOT DETECTED Final   Streptococcus species NOT DETECTED NOT DETECTED Final   Streptococcus agalactiae NOT DETECTED NOT DETECTED Final   Streptococcus pneumoniae NOT DETECTED NOT DETECTED Final   Streptococcus pyogenes NOT DETECTED NOT DETECTED Final   A.calcoaceticus-baumannii NOT DETECTED NOT DETECTED Final   Bacteroides fragilis NOT DETECTED NOT DETECTED Final   Enterobacterales NOT DETECTED NOT DETECTED Final   Enterobacter cloacae complex NOT DETECTED NOT DETECTED Final   Escherichia coli NOT DETECTED NOT DETECTED Final   Klebsiella aerogenes NOT DETECTED NOT DETECTED Final   Klebsiella oxytoca NOT DETECTED NOT DETECTED Final   Klebsiella pneumoniae NOT DETECTED NOT DETECTED Final   Proteus species NOT DETECTED NOT DETECTED Final   Salmonella species NOT DETECTED NOT DETECTED Final   Serratia marcescens NOT DETECTED NOT DETECTED Final   Haemophilus influenzae NOT DETECTED NOT DETECTED Final   Neisseria meningitidis NOT DETECTED NOT DETECTED Final   Pseudomonas aeruginosa NOT DETECTED NOT DETECTED Final   Stenotrophomonas maltophilia NOT DETECTED NOT DETECTED Final   Candida albicans NOT DETECTED NOT DETECTED Final   Candida auris NOT DETECTED NOT DETECTED Final   Candida glabrata NOT DETECTED NOT DETECTED Final   Candida krusei NOT DETECTED NOT DETECTED Final   Candida parapsilosis NOT DETECTED NOT DETECTED Final   Candida  tropicalis NOT DETECTED NOT DETECTED Final   Cryptococcus neoformans/gattii NOT DETECTED NOT DETECTED Final    Comment: Performed at [facility], [address], [place], [place] [address]  Culture, blood (routine x 2)     Status: None (Preliminary result)   Collection Time: 12/29/22  6:30 PM   Specimen: BLOOD LEFT WRIST  Result Value Ref Range Status   Specimen Description   Final    BLOOD LEFT WRIST Performed at [facility], [address] [place], [place] [address]    Special Requests   Final    Blood Culture results may not be optimal due to an inadequate volume of blood received in culture bottles BOTTLES DRAWN AEROBIC AND ANAEROBIC Performed at [facility], [address] [place], [place] [address]    Culture   Final    NO GROWTH 4 DAYS Performed at [facility], [address] [place], [place] [address]    Report Status PENDING  Incomplete     Time coordinating discharge: 35 minutes  SIGNED:   Alwyn Ren, MD  [facility] 01/02/2023, 4:15 PM

## 2023-01-02 NOTE — Progress Notes (Signed)
Responded to consult requesting lab draw. Spoke to RN who stated labs have been obtained. Consult cleared.

## 2023-01-02 NOTE — Care Management Important Message (Signed)
Important Message  Patient Details IM Letter given. Name: Alyssa Howard MRN: [id number] Date of Birth: [birth year]   Medicare Important Message Given:  Yes     Caren Macadam 01/02/2023, 11:28 AM

## 2023-01-03 LAB — CULTURE, BLOOD (ROUTINE X 2): Culture: NO GROWTH

## 2023-03-19 IMAGING — CT CT L SPINE W/O CM
4 of 7 series · 13 of 33 positions shown, 14 images · non-contrast
Comparison: Prior dedicated CT of the lumbar spine, CT abdomen
pelvis 02/14/2017

CLINICAL DATA: Back pain, fall



[Series 3: orthogonal axial st · axial · 0.21mm/px · z∈[+778,+858]mm · 3 of 72 slices shown]
[im 18/72  bone]
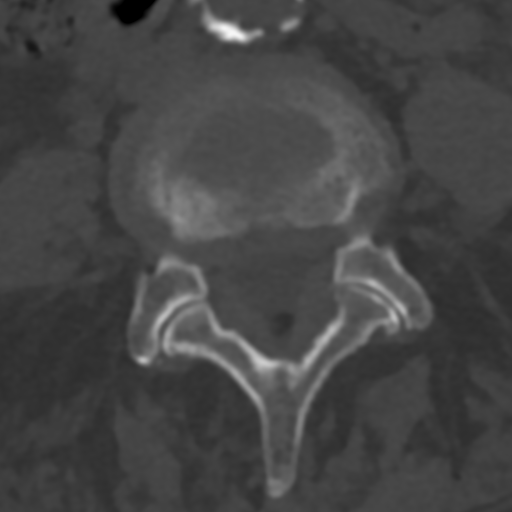
[im 36/72  bone]
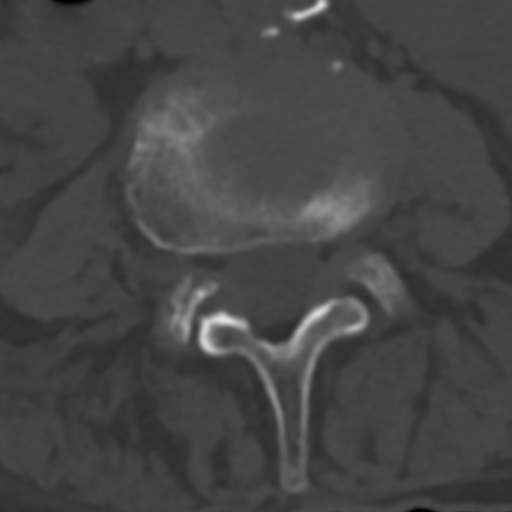
[im 54/72  bone]
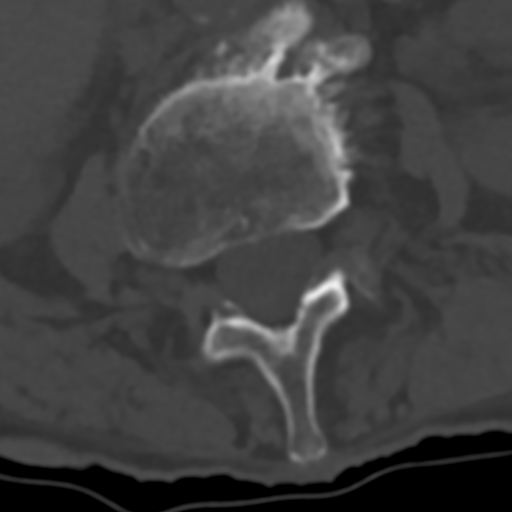

[Series 13: l spine 2.0 st · axial · 0.29mm/px · z∈[+760,+856]mm · 4 of 80 slices shown, 5 images]
[im 16/80  soft-tissue]
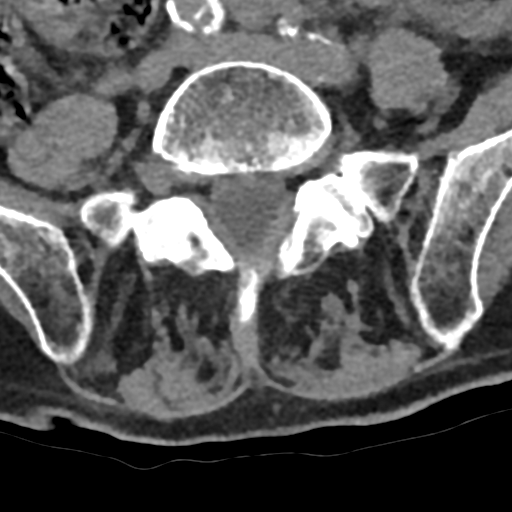
[im 16/80  bone]
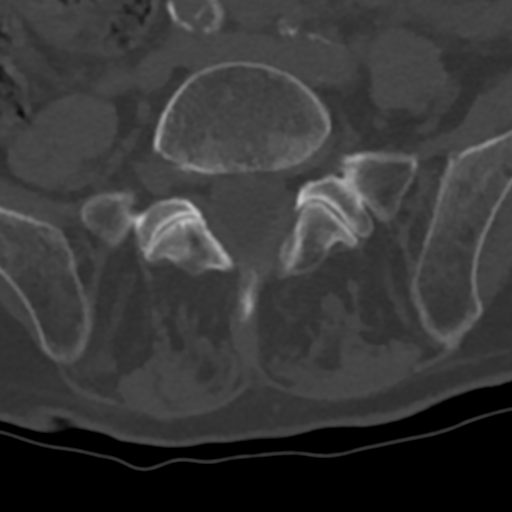
[im 32/80  bone]
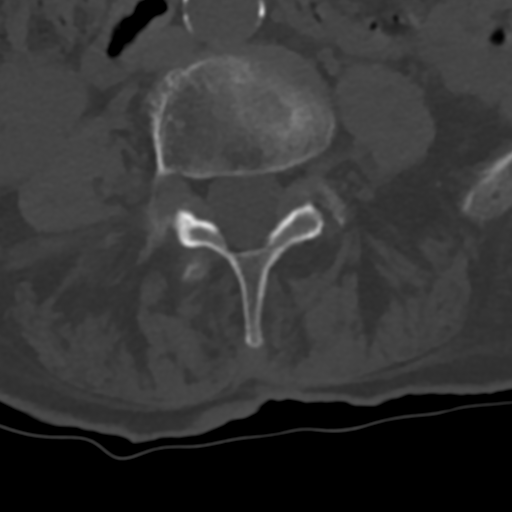
[im 48/80  bone]
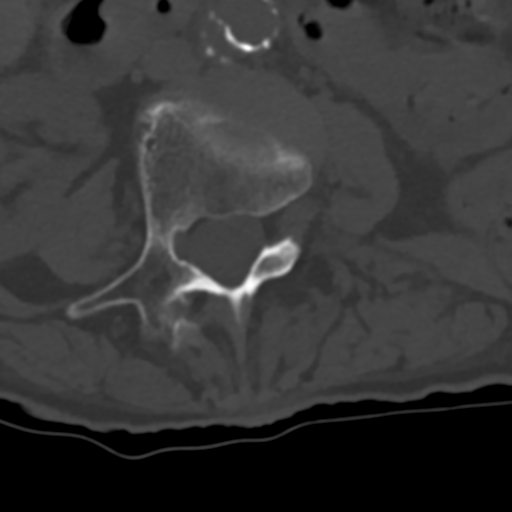
[im 64/80  bone]
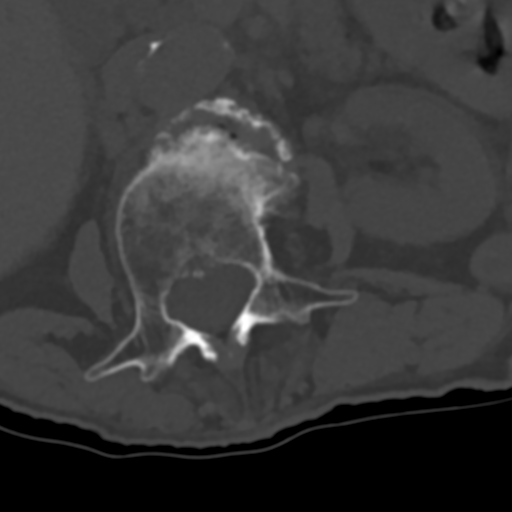

[Series 15: coronal st · coronal · 0.32mm/px · 1 of 61 slices shown]
[im 31/61  bone]
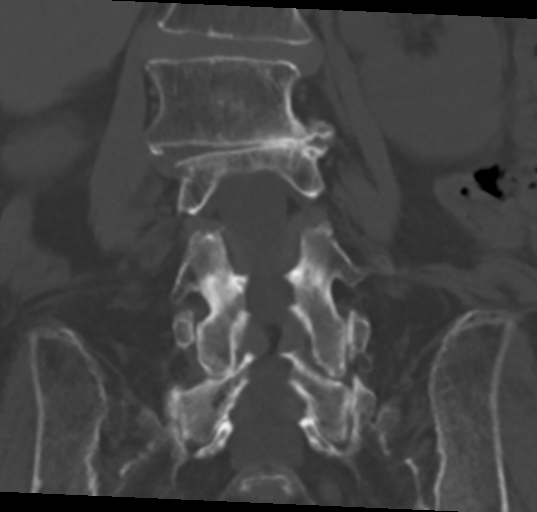

[Series 16: sagittal st · sagittal · 0.23mm/px · 5 of 72 slices shown]
[im 12/72  bone]
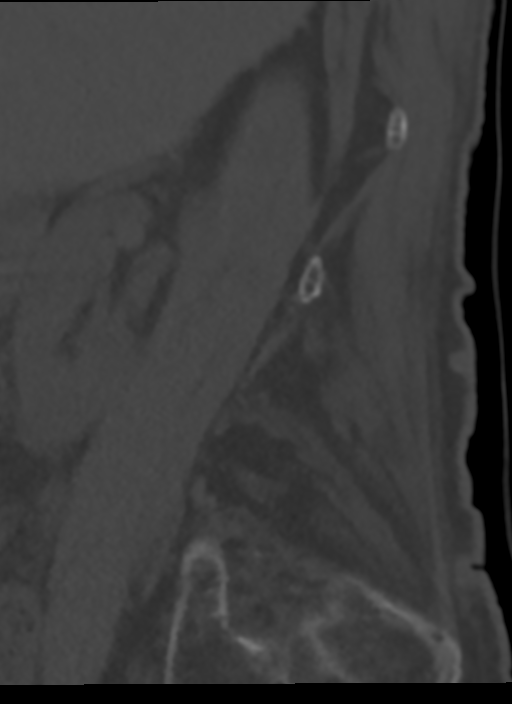
[im 24/72  bone]
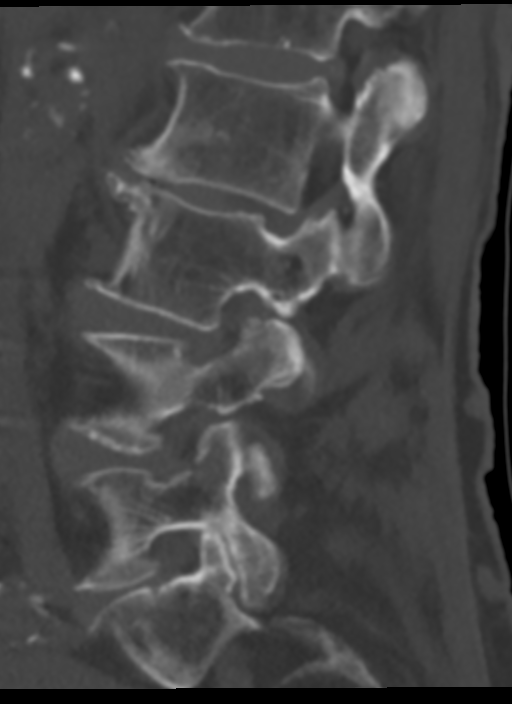
[im 36/72  bone]
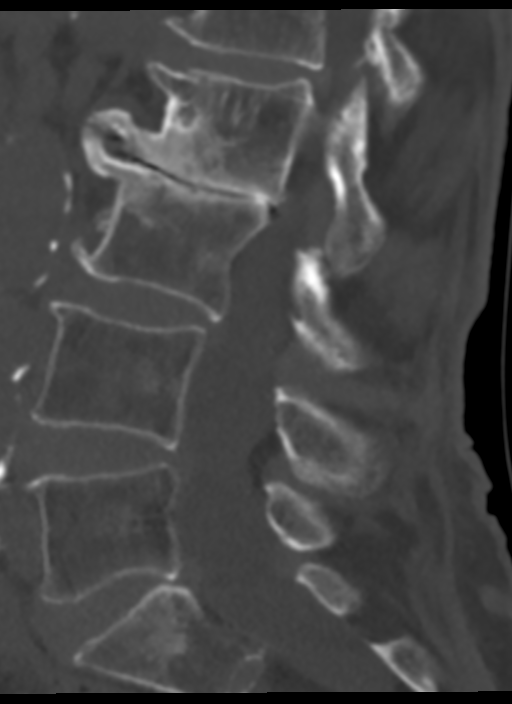
[im 48/72  bone]
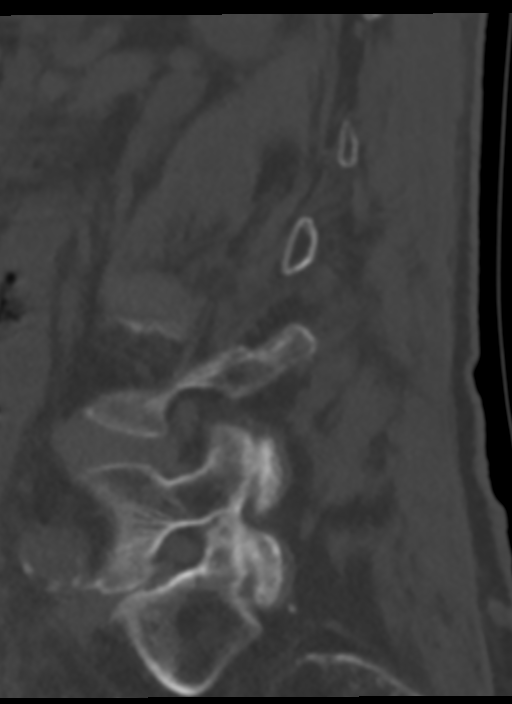
[im 60/72  bone]
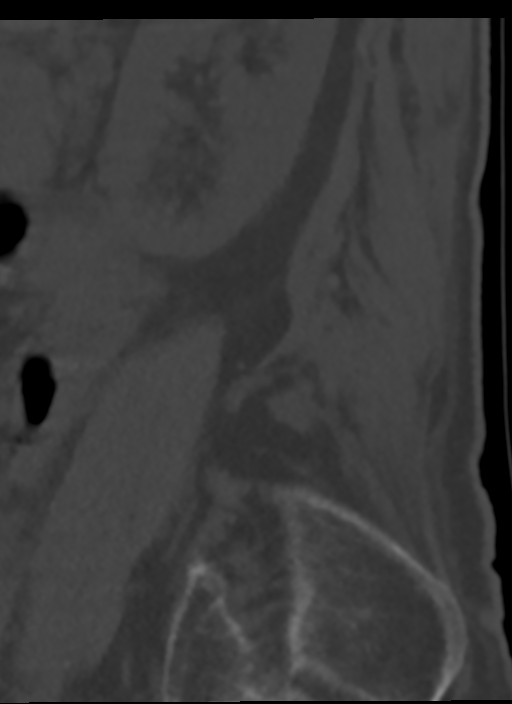

[13 of 33 positions shown; findings below may reference images not displayed]

FINDINGS: Segmentation: 5 lumbar type vertebrae.

Alignment: Trace retrolisthesis of L2 on L3, unchanged. S shaped
curvature of the thoracolumbar spine.

Vertebrae: Acute fracture through the anterior superior endplate of
T12. Approximately 10% vertebral body height loss. No evidence of
retropulsion of posterior cortex. No other fracture in the imaged
lumbar spine.

Paraspinal and other soft tissues: Aortic atherosclerosis,
incompletely imaged.

Disc levels: Multilevel degenerative changes without significant
spinal canal stenosis mild right neural foraminal narrowing at L3-L4
and L4-L5.
IMPRESSION: Acute fracture of the anterior superior endplate of T12 with
approximately 10% vertebral body height loss.

## 2023-03-19 IMAGING — CT CT HEAD W/O CM
3 series · 15 of 47 positions shown, 18 images · non-contrast
Comparison: 10/30/2020

CLINICAL DATA: Head trauma, minor (Age >= 65y).  Fall.



[Series 1: head 5.0 h30s · axial · 0.43mm/px · z∈[+1267,+1397]mm · 9 of 32 slices shown, 12 images]
[im 3/32  brain]
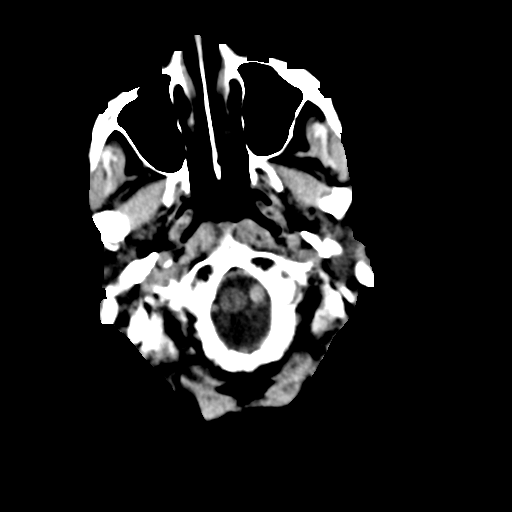
[im 3/32  bone]
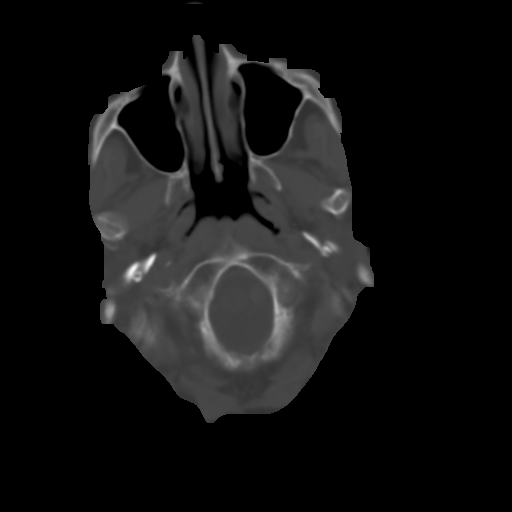
[im 6/32  brain]
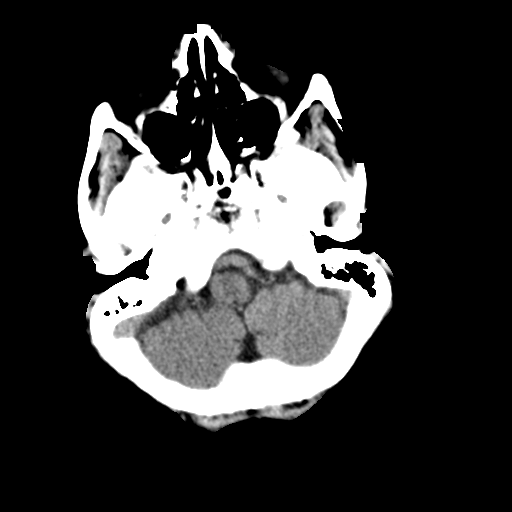
[im 9/32  brain]
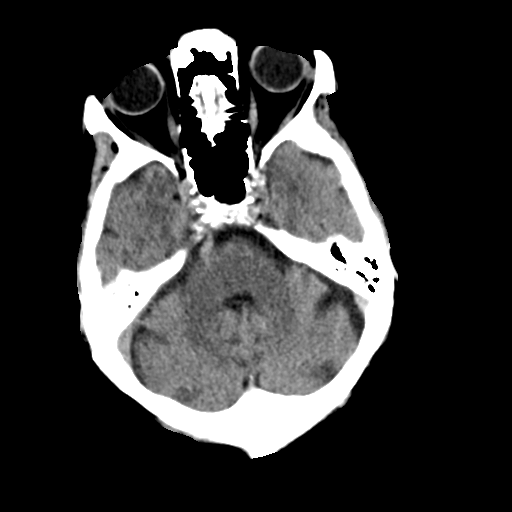
[im 12/32  brain]
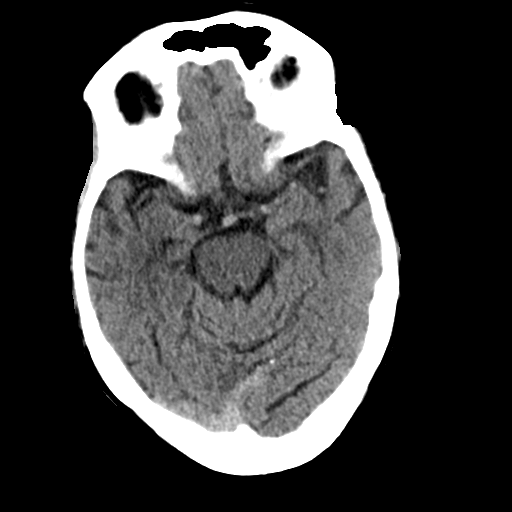
[im 17/32  brain]
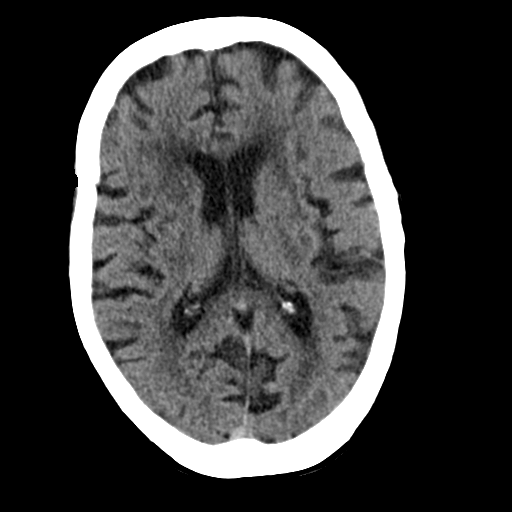
[im 17/32  bone]
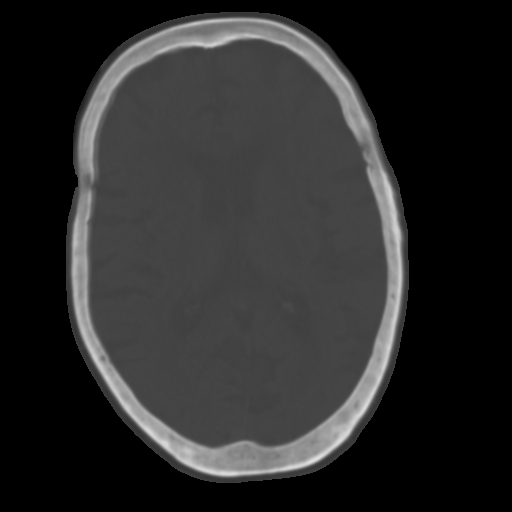
[im 20/32  brain]
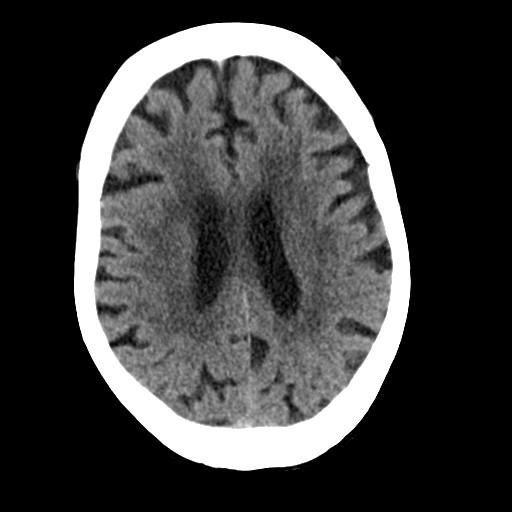
[im 23/32  brain]
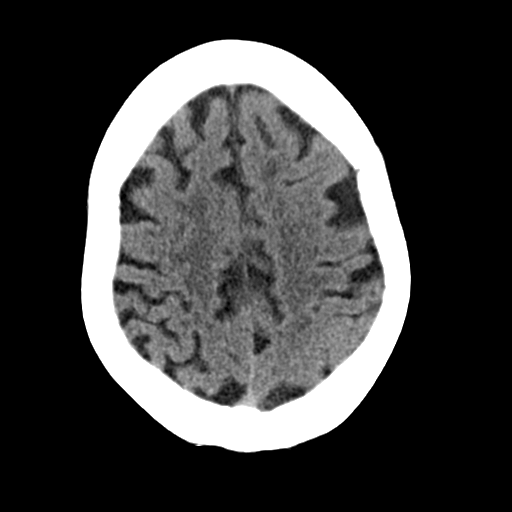
[im 26/32  brain]
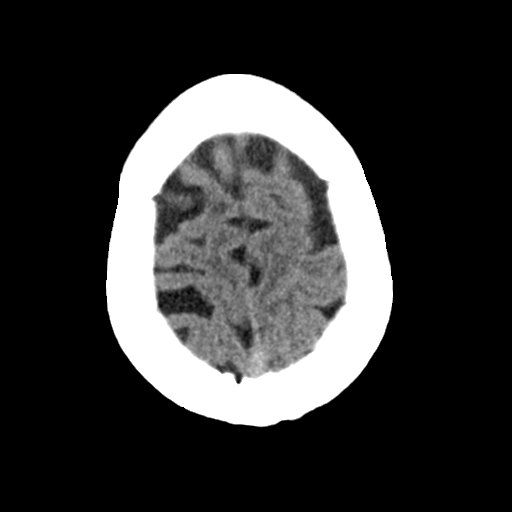
[im 29/32  brain]
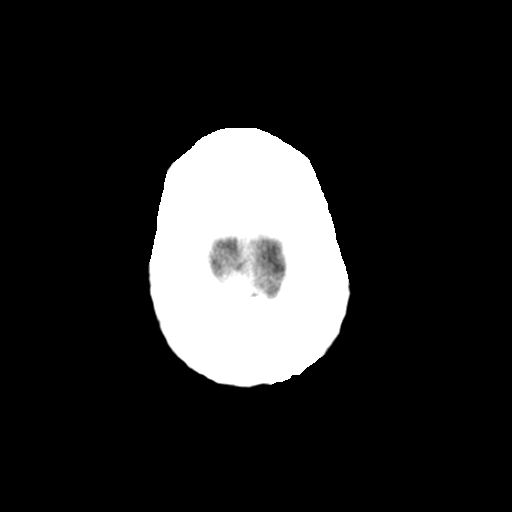
[im 29/32  bone]
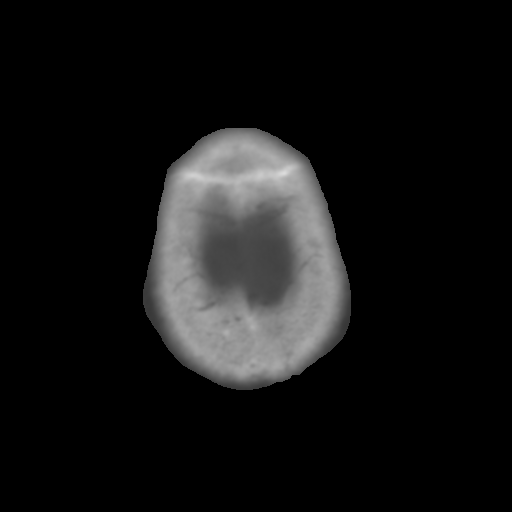

[Series 4: head 3.0 mpr cor · coronal · 0.33mm/px · 3 of 70 slices shown]
[im 24/70  brain]
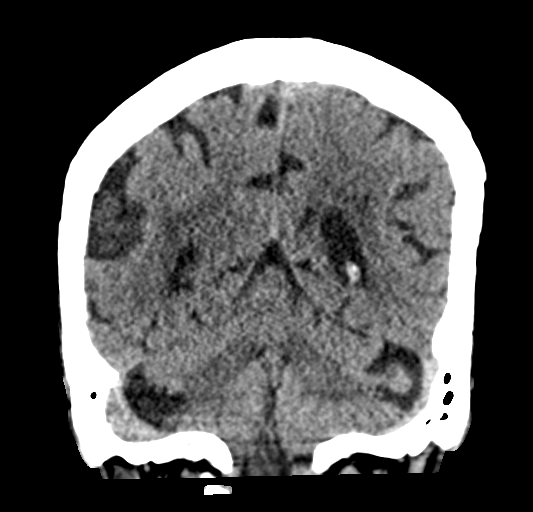
[im 31/70  brain]
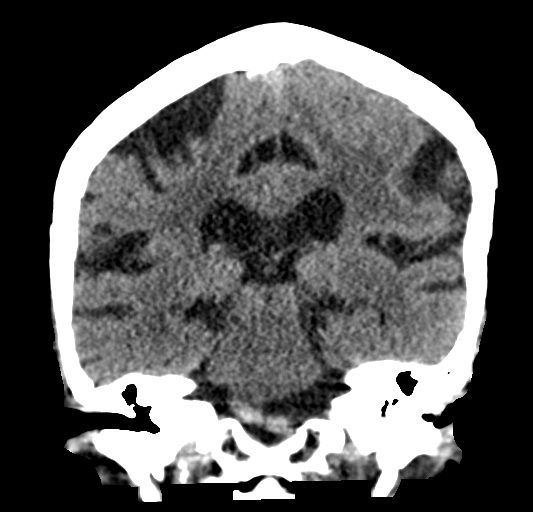
[im 39/70  brain]
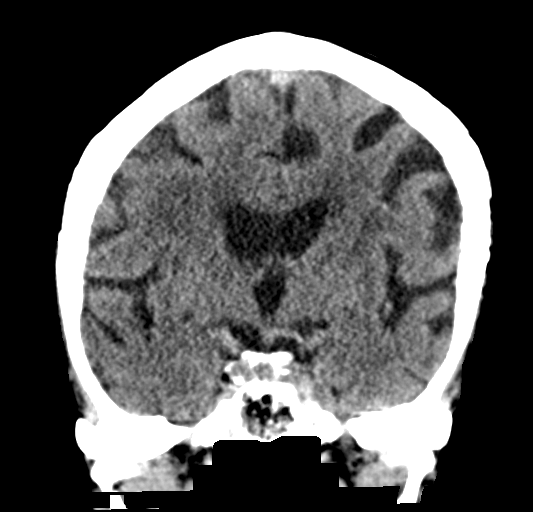

[Series 5: head 3.0 mpr sag · sagittal · 0.33mm/px · 3 of 53 slices shown]
[im 18/53  brain]
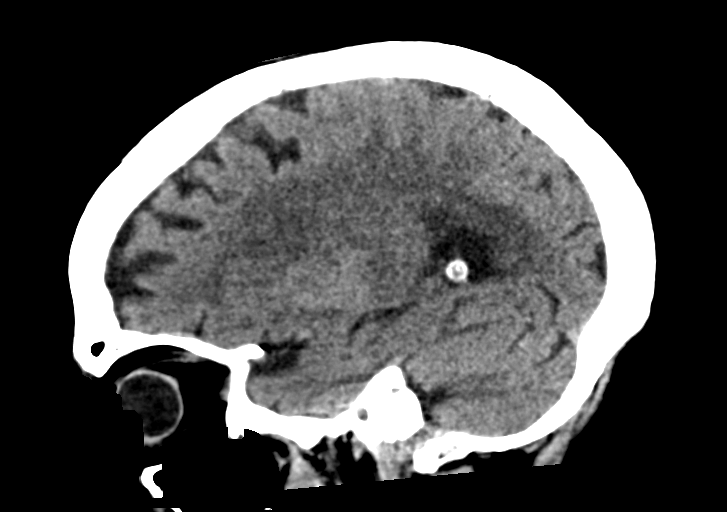
[im 27/53  brain]
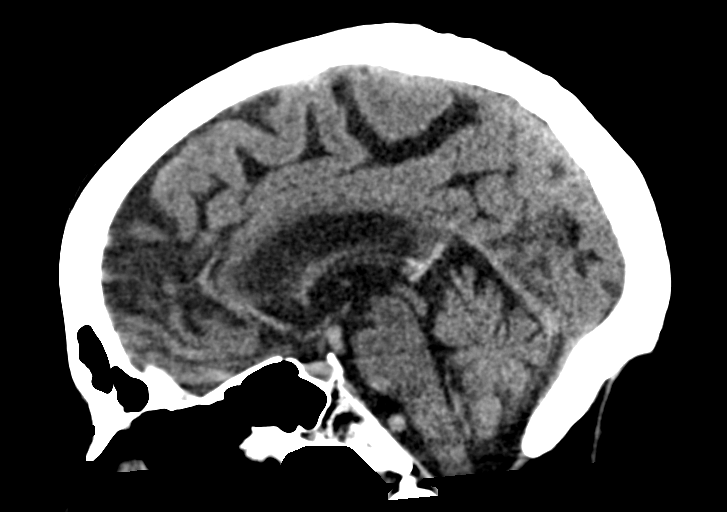
[im 35/53  brain]
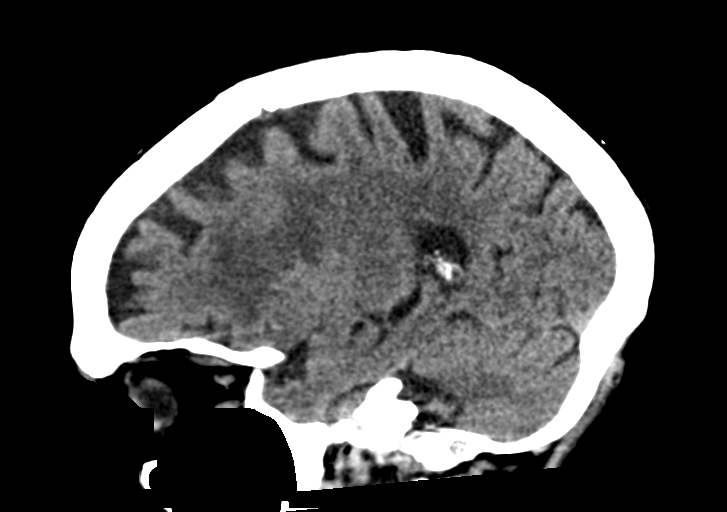

[15 of 47 positions shown; findings below may reference images not displayed]

FINDINGS: Brain: Normal anatomic configuration. Parenchymal volume loss is
commensurate with the patient's age. Moderate periventricular white
matter changes are present likely reflecting the sequela of small
vessel ischemia. No abnormal intra or extra-axial mass lesion or
fluid collection. No abnormal mass effect or midline shift. No
evidence of acute intracranial hemorrhage or infarct. Ventricular
size is normal. Cerebellum unremarkable.

Vascular: No asymmetric hyperdense vasculature at the skull base.

Skull: Intact

Sinuses/Orbits: Paranasal sinuses are clear. Ocular lenses have been
removed. Orbits are otherwise unremarkable.

Other: Mastoid air cells and middle ear cavities are clear.
IMPRESSION: No acute intracranial injury.  No calvarial fracture.

Stable senescent change.

## 2023-08-25 DIAGNOSIS — Z85828 Personal history of other malignant neoplasm of skin: Secondary | ICD-10-CM | POA: Diagnosis not present

## 2023-08-25 DIAGNOSIS — L720 Epidermal cyst: Secondary | ICD-10-CM | POA: Diagnosis not present
